# Patient Record
Sex: Female | Born: 1965 | Race: White | Hispanic: No
Health system: Midwestern US, Community
[De-identification: ages and names within clinical notes are randomized; demographics above are authoritative.]

## PROBLEM LIST (undated history)

## (undated) DIAGNOSIS — Z1231 Encounter for screening mammogram for malignant neoplasm of breast: Secondary | ICD-10-CM

## (undated) DIAGNOSIS — F419 Anxiety disorder, unspecified: Secondary | ICD-10-CM

## (undated) DIAGNOSIS — I10 Essential (primary) hypertension: Secondary | ICD-10-CM

---

## 2009-12-04 ENCOUNTER — Encounter: Admission: RE | Admit: 2009-12-04 | Discharge: 2009-12-04 | Payer: Self-pay | Admitting: Neurological Surgery

## 2013-03-12 ENCOUNTER — Ambulatory Visit (HOSPITAL_COMMUNITY): Payer: Self-pay | Admitting: Psychiatry

## 2016-08-05 ENCOUNTER — Emergency Department (HOSPITAL_BASED_OUTPATIENT_CLINIC_OR_DEPARTMENT_OTHER)
Admission: EM | Admit: 2016-08-05 | Discharge: 2016-08-05 | Disposition: A | Discharge status: Home / Self Care | Payer: Self-pay | Attending: Emergency Medicine | Admitting: Emergency Medicine

## 2016-08-05 ENCOUNTER — Encounter (HOSPITAL_BASED_OUTPATIENT_CLINIC_OR_DEPARTMENT_OTHER): Payer: Self-pay | Admitting: *Deleted

## 2016-08-05 DIAGNOSIS — I10 Essential (primary) hypertension: Secondary | ICD-10-CM | POA: Insufficient documentation

## 2016-08-05 DIAGNOSIS — K029 Dental caries, unspecified: Secondary | ICD-10-CM | POA: Insufficient documentation

## 2016-08-05 DIAGNOSIS — Z79899 Other long term (current) drug therapy: Secondary | ICD-10-CM | POA: Insufficient documentation

## 2016-08-05 HISTORY — DX: Essential (primary) hypertension: I10

## 2016-08-05 HISTORY — DX: Anxiety disorder, unspecified: F41.9

## 2016-08-05 LAB — PREGNANCY, URINE: Preg Test, Ur: NEGATIVE

## 2016-08-05 MED ORDER — DOXYCYCLINE HYCLATE 100 MG PO TABS
100.0000 mg | ORAL_TABLET | Freq: Once | ORAL | Status: AC
  Administered 2016-08-05: 100 mg via ORAL
  Filled 2016-08-05: qty 1

## 2016-08-05 MED ORDER — NAPROXEN 500 MG PO TABS: 500.0000 mg | ORAL_TABLET | Freq: Two times a day (BID) | ORAL | 0 refills | Status: DC

## 2016-08-05 MED ORDER — DOXYCYCLINE HYCLATE 100 MG PO CAPS: 100.0000 mg | ORAL_CAPSULE | Freq: Two times a day (BID) | ORAL | 0 refills | Status: DC

## 2016-08-05 NOTE — ED Triage Notes (Signed)
Toothache x 2 weeks. Pain radiates into her left ear.

## 2016-08-05 NOTE — ED Notes (Signed)
Pt verbalizes understanding of d/c instructions and denies any further needs at this time. 

## 2016-08-05 NOTE — ED Notes (Addendum)
Patient c/o left lower dental pain, slight swelling to left lower jaw. Patient c/o pain radiating into her ear. Patient states she has not called a dentist due to cost. Patient is awaiting appointment for "orange card" on 3/19. Patient took 1 Aleve at @ 1530 today.

## 2016-08-05 NOTE — ED Provider Notes (Signed)
[facility] Provider Note   CSN: [id number] Arrival date & time: 08/05/16  2006   By signing my name below, I, Soijett Blue, attest that this documentation has been prepared under the direction and in the presence of Fayrene Helper, PA-C Electronically Signed: Soijett Blue, ED Scribe. 08/05/16. 9:39 PM.  History   Chief Complaint Chief Complaint  Patient presents with  . Dental Pain    HPI Natasha Day is a 51 y.o. female with a PMHx of HTN, who presents to the Emergency Department complaining of sharp left lower dental pain onset 1 week ago. Pt reports associated left ear pain and left sided neck pain. Pt has tried ibuprofen with no relief of her symptoms. Pt left lower dental pain is worsened with temperature change. She states that she doesn't have a dentist, but notes that she is waiting for her "orange card" insurance to begin on 3/19. She denies fever, chills, drainage, and any other symptoms. Denies being pregnant at this time.   The history is provided by the patient. No language interpreter was used.    Past Medical History:  Diagnosis Date  . Anxiety   . Hypertension     There are no active problems to display for this patient.   No past surgical history on file.  OB History    No data available       Home Medications    Prior to Admission medications   Medication Sig Start Date End Date Taking? Authorizing Provider  AMLODIPINE-ATORVASTATIN PO Take by mouth.   Yes Historical Provider, MD  Cyclobenzaprine HCl (FLEXERIL PO) Take by mouth.   Yes Historical Provider, MD  DULoxetine HCl (CYMBALTA PO) Take by mouth.   Yes Historical Provider, MD  HYDROXYZINE HCL PO Take by mouth.   Yes Historical Provider, MD  IBUPROFEN PO Take by mouth.   Yes Historical Provider, MD    Family History No family history on file.  Social History Social History  Substance Use Topics  . Smoking status: Not on file  . Smokeless tobacco: Not on file  . Alcohol  use Not on file     Allergies   Patient has no allergy information on record.   Review of Systems Review of Systems  Constitutional: Negative for chills and fever.  HENT: Positive for dental problem (left lower) and ear pain (left).        No drainage to affected areas  Musculoskeletal: Positive for neck pain (left sided).     Physical Exam Updated Vital Signs BP (!) 183/106 (BP Location: Left Arm)   Pulse 88   Temp 99 F (37.2 C) (Oral)   Resp 18   Ht 5\' 8"  (1.727 m)   Wt 225 lb (102.1 kg)   SpO2 97%   BMI 34.21 kg/m   Physical Exam  Constitutional: She is oriented to person, place, and time. She appears well-developed and well-nourished. No distress.  HENT:  Head: Normocephalic and atraumatic.  Right Ear: Tympanic membrane, external ear and ear canal normal.  Left Ear: Tympanic membrane, external ear and ear canal normal.  Mouth/Throat: No trismus in the jaw. Abnormal dentition. No dental abscesses.  Moderate dental decay noted to lower jaw with tenderness to tooth #17, but without any obvious abscess available for drainage. No trismus. Partial maxillary denture.   Eyes: EOM are normal.  Neck: Neck supple.  Cardiovascular: Normal rate.   Pulmonary/Chest: Effort normal. No respiratory distress.  Abdominal: She exhibits no distension.  Musculoskeletal: Normal range  of motion.  Neurological: She is alert and oriented to person, place, and time.  Skin: Skin is warm and dry.  Psychiatric: She has a normal mood and affect. Her behavior is normal.  Nursing note and vitals reviewed.    ED Treatments / Results  DIAGNOSTIC STUDIES: Oxygen Saturation is 97% on RA, nl by my interpretation.    COORDINATION OF CARE: 9:35 PM Discussed treatment plan with pt at bedside which includes doxycycline Rx, referral and follow up with dentist, and pt agreed to plan. Procedures Procedures (including critical care time)  Medications Ordered in ED Medications - No data to  display   Initial Impression / Assessment and Plan / ED Course  I have reviewed the triage vital signs and the nursing notes.  BP (!) 183/106 (BP Location: Left Arm)   Pulse 88   Temp 99 F (37.2 C) (Oral)   Resp 18   Ht 5\' 8"  (1.727 m)   Wt 102.1 kg   SpO2 97%   BMI 34.21 kg/m  The patient was noted to be hypertensive today in the emergency department. I have spoken with the patient regarding hypertension and the need for improved management. I instructed the patient to followup with the Primary care doctor within 4 days to improve the management of the patient's hypertension. I also counseled the patient regarding the signs and symptoms which would require an emergent visit to an emergency department for hypertensive urgency and/or hypertensive emergency. The patient understood the need for improved hypertensive management.  Patient with dentalgia.  No abscess requiring immediate incision and drainage.  Exam not concerning for Ludwig's angina or pharyngeal abscess. Pain is reproducible, no associate cp/sob to suggest cardiac pathology. Will treat with doxycycline Rx. Pt instructed to follow-up with dentist.  Discussed return precautions. Pt safe for discharge.  Final Clinical Impressions(s) / ED Diagnoses   Final diagnoses:  Pain due to dental caries    New Prescriptions New Prescriptions   DOXYCYCLINE (VIBRAMYCIN) 100 MG CAPSULE    Take 1 capsule (100 mg total) by mouth 2 (two) times daily. One po bid x 7 days   NAPROXEN (NAPROSYN) 500 MG TABLET    Take 1 tablet (500 mg total) by mouth 2 (two) times daily.    I personally performed the services described in this documentation, which was scribed in my presence. The recorded information has been reviewed and is accurate.       Fayrene HelperBowie Fredricka Kohrs, PA-C 08/05/16 2224    Nira ConnPedro Eduardo Cardama, MD 08/06/16 Georgiann [id number]

## 2016-08-05 NOTE — Discharge Instructions (Signed)
Please follow up with a dentist for further management of your dental pain.  Take antibiotic and pain medication as prescribed.  Your blood pressure is high today, please have it recheck by your primary care provider this week.

## 2017-05-12 ENCOUNTER — Other Ambulatory Visit: Payer: Self-pay

## 2017-05-12 ENCOUNTER — Encounter (HOSPITAL_BASED_OUTPATIENT_CLINIC_OR_DEPARTMENT_OTHER): Payer: Self-pay | Admitting: *Deleted

## 2017-05-12 ENCOUNTER — Emergency Department (HOSPITAL_BASED_OUTPATIENT_CLINIC_OR_DEPARTMENT_OTHER)
Admission: EM | Admit: 2017-05-12 | Discharge: 2017-05-12 | Disposition: A | Discharge status: Home / Self Care | Payer: Self-pay | Attending: Emergency Medicine | Admitting: Emergency Medicine

## 2017-05-12 DIAGNOSIS — F17200 Nicotine dependence, unspecified, uncomplicated: Secondary | ICD-10-CM | POA: Insufficient documentation

## 2017-05-12 DIAGNOSIS — Z79899 Other long term (current) drug therapy: Secondary | ICD-10-CM | POA: Insufficient documentation

## 2017-05-12 DIAGNOSIS — K0889 Other specified disorders of teeth and supporting structures: Secondary | ICD-10-CM | POA: Insufficient documentation

## 2017-05-12 DIAGNOSIS — I10 Essential (primary) hypertension: Secondary | ICD-10-CM | POA: Insufficient documentation

## 2017-05-12 MED ORDER — IBUPROFEN 400 MG PO TABS
400.0000 mg | ORAL_TABLET | Freq: Once | ORAL | Status: AC | PRN
  Administered 2017-05-12: 400 mg via ORAL
  Filled 2017-05-12: qty 1

## 2017-05-12 MED ORDER — DOXYCYCLINE HYCLATE 100 MG PO TABS
100.0000 mg | ORAL_TABLET | Freq: Once | ORAL | Status: AC
  Administered 2017-05-12: 100 mg via ORAL
  Filled 2017-05-12: qty 1

## 2017-05-12 MED ORDER — LIDOCAINE VISCOUS 2 % MT SOLN
15.0000 mL | Freq: Once | OROMUCOSAL | Status: AC
  Administered 2017-05-12: 15 mL via OROMUCOSAL
  Filled 2017-05-12: qty 15

## 2017-05-12 MED ORDER — DOXYCYCLINE HYCLATE 100 MG PO CAPS: 100.0000 mg | ORAL_CAPSULE | Freq: Two times a day (BID) | ORAL | 0 refills | Status: DC

## 2017-05-12 MED FILL — DOXYCYCLINE HYCLATE 100 MG: 100 | 10 days supply | Qty: 20 | Fill #0

## 2017-05-12 NOTE — ED Notes (Signed)
Pt asked me for pain medication. When I returned with the medication she was not in the waiting room.

## 2017-05-12 NOTE — ED Triage Notes (Signed)
Toothache since yesterday 

## 2017-05-12 NOTE — ED Provider Notes (Signed)
[facility] EMERGENCY DEPARTMENT Provider Note   CSN: [id number] Arrival date & time: 05/12/17  1150     History   Chief Complaint Chief Complaint  Patient presents with  . Dental Pain    HPI Natasha Day is a 51 y.o. female.  Patient presents to the emergency department with a dental complaint. Symptoms began 2 days ago. The patient has tried to alleviate pain with OTC meds.  Pain rated at a 10/10, characterized as throbbing in nature and located right lower rear molar. Patient denies fever, night sweats, chills, difficulty swallowing or opening mouth, SOB, nuchal rigidity or decreased ROM of neck.  Patient does not have a dentist and requests a resource guide at discharge.    The history is provided by the patient. No language interpreter was used.    Past Medical History:  Diagnosis Date  . Anxiety   . Hypertension     There are no active problems to display for this patient.   History reviewed. No pertinent surgical history.  OB History    No data available       Home Medications    Prior to Admission medications   Medication Sig Start Date End Date Taking? Authorizing Provider  AMLODIPINE-ATORVASTATIN PO Take by mouth.   Yes [provider]  ATENOLOL PO Take by mouth.   Yes [provider]  Cyclobenzaprine HCl (FLEXERIL PO) Take by mouth.   Yes [provider]  DULoxetine HCl (CYMBALTA PO) Take by mouth.   Yes [provider]  HYDROXYZINE HCL PO Take by mouth.   Yes [provider]  IBUPROFEN PO Take by mouth.   Yes [provider]  doxycycline (VIBRAMYCIN) 100 MG capsule Take 1 capsule (100 mg total) by mouth 2 (two) times daily. 05/12/17   Roxy HorsemanBrowning, Mitsuye Schrodt, PA-C  naproxen (NAPROSYN) 500 MG tablet Take 1 tablet (500 mg total) by mouth 2 (two) times daily. 08/05/16   Fayrene Helperran, Bowie, PA-C    Family History No family history on file.  Social History Social History   Tobacco Use  . Smoking  status: Current Every Day Smoker  . Smokeless tobacco: Never Used  Substance Use Topics  . Alcohol use: Not on file  . Drug use: Not on file     Allergies   Erythromycin; Norco [hydrocodone-acetaminophen]; Penicillins; and Shellfish allergy   Review of Systems Review of Systems  Constitutional: Negative for chills and fever.  HENT: Positive for dental problem. Negative for drooling.   Neurological: Negative for speech difficulty.  Psychiatric/Behavioral: Positive for sleep disturbance.  All other systems reviewed and are negative.    Physical Exam Updated Vital Signs BP (!) 159/111 (BP Location: Right Arm)   Pulse 89   Temp 98.1 F (36.7 C) (Oral)   Resp 18   Ht 5' 8.75" (1.746 m)   Wt 106.6 kg (235 lb)   LMP 05/05/2017   SpO2 100%   BMI 34.96 kg/m   Physical Exam  Physical Exam  Constitutional: Pt appears well-developed and well-nourished.  HENT:  Head: Normocephalic.  Right Ear: Tympanic membrane, external ear and ear canal normal.  Left Ear: Tympanic membrane, external ear and ear canal normal.  Nose: Nose normal. Right sinus exhibits no maxillary sinus tenderness and no frontal sinus tenderness. Left sinus exhibits no maxillary sinus tenderness and no frontal sinus tenderness.  Mouth/Throat: Uvula is midline, oropharynx is clear and moist and mucous membranes are normal. No oral lesions. No uvula swelling or lacerations. No oropharyngeal  exudate, posterior oropharyngeal edema, posterior oropharyngeal erythema or tonsillar abscesses.  Poor dentition No gingival swelling, fluctuance or induration No gross abscess  No sublingual edema, tenderness to palpation, or sign of Ludwig's angina, or deep space infection Pain at right lower rear molar Eyes: Conjunctivae are normal. Pupils are equal, round, and reactive to light. Right eye exhibits no discharge. Left eye exhibits no discharge.  Neck: Normal range of motion. Neck supple.  No stridor Handling secretions  without difficulty No nuchal rigidity No cervical lymphadenopathy Cardiovascular: Normal rate, regular rhythm and normal heart sounds.   Pulmonary/Chest: Effort normal. No respiratory distress.  Equal chest rise  Abdominal: Soft. Bowel sounds are normal. Pt exhibits no distension. There is no tenderness.  Lymphadenopathy: Pt has no cervical adenopathy.  Neurological: Pt is alert and oriented x 4  Skin: Skin is warm and dry.  Psychiatric: Pt has a normal mood and affect.  Nursing note and vitals reviewed.   ED Treatments / Results  Labs (all labs ordered are listed, but only abnormal results are displayed) Labs Reviewed - No data to display  EKG  EKG Interpretation None       Radiology No results found.  Procedures Procedures (including critical care time)  Medications Ordered in ED Medications  ibuprofen (ADVIL,MOTRIN) tablet 400 mg (400 mg Oral Given 05/12/17 1248)  lidocaine (XYLOCAINE) 2 % viscous mouth solution 15 mL (15 mLs Mouth/Throat Given 05/12/17 1513)  doxycycline (VIBRA-TABS) tablet 100 mg (100 mg Oral Given 05/12/17 1538)     Initial Impression / Assessment and Plan / ED Course  I have reviewed the triage vital signs and the nursing notes.  Pertinent labs & imaging results that were available during my care of the patient were reviewed by me and considered in my medical decision making (see chart for details).     Patient with dentalgia.  No abscess requiring immediate incision and drainage.  Exam not concerning for Ludwig's angina or pharyngeal abscess.  Will treat with doxy per pharmD recommendations given multiple allergies. Pt instructed to follow-up with dentist.  Discussed return precautions. Pt safe for discharge.   Final Clinical Impressions(s) / ED Diagnoses   Final diagnoses:  Pain, dental    ED Discharge Orders        Ordered    doxycycline (VIBRAMYCIN) 100 MG capsule  2 times daily     05/12/17 1530       Roxy HorsemanBrowning, Joie Hipps,  PA-C 05/12/17 1551    Tegeler, Canary Brimhristopher J, MD 05/12/17 1723

## 2018-04-20 ENCOUNTER — Ambulatory Visit (HOSPITAL_COMMUNITY): Payer: Self-pay | Admitting: Orthopedic Surgery

## 2018-05-26 ENCOUNTER — Other Ambulatory Visit (HOSPITAL_COMMUNITY): Payer: Self-pay

## 2018-06-03 ENCOUNTER — Inpatient Hospital Stay: Admit: 2018-06-03 | Payer: Self-pay | Admitting: Orthopedic Surgery

## 2018-06-03 SURGERY — ANTERIOR CERVICAL DECOMPRESSION/DISCECTOMY FUSION 4 LEVELS
Anesthesia: General

## 2018-06-04 ENCOUNTER — Inpatient Hospital Stay: Admit: 2018-06-04 | Payer: Self-pay | Admitting: Orthopedic Surgery

## 2018-06-04 SURGERY — POSTERIOR CERVICAL FUSION/FORAMINOTOMY LEVEL 4
Anesthesia: General

## 2018-06-29 ENCOUNTER — Encounter: Payer: Self-pay | Admitting: Cardiology

## 2018-06-29 ENCOUNTER — Ambulatory Visit (INDEPENDENT_AMBULATORY_CARE_PROVIDER_SITE_OTHER): Payer: No Typology Code available for payment source | Admitting: Cardiology

## 2018-06-29 VITALS — BP 144/102 | HR 85 | Ht 68.0 in | Wt 235.6 lb

## 2018-06-29 DIAGNOSIS — I10 Essential (primary) hypertension: Secondary | ICD-10-CM

## 2018-06-29 DIAGNOSIS — R002 Palpitations: Secondary | ICD-10-CM

## 2018-06-29 DIAGNOSIS — Z7189 Other specified counseling: Secondary | ICD-10-CM

## 2018-06-29 DIAGNOSIS — R079 Chest pain, unspecified: Secondary | ICD-10-CM

## 2018-06-29 DIAGNOSIS — Z716 Tobacco abuse counseling: Secondary | ICD-10-CM

## 2018-06-29 NOTE — Progress Notes (Signed)
Cardiology Office Note:    Date:  06/29/2018   ID:  Natasha Day, DOB 1965-07-03, MRN [id number]  PCP:  [facility]  Cardiologist:  Jodelle Red, MD PhD  Referring MD: Nechama Guard, FNP   CC: chest pain and palpitations  History of Present Illness:    Natasha Day is a 53 y.o. female with a hx of hypertension, hyperlipidemia, tobacco use who is seen as a new consult at the request of Nechama Guard, FNP for the evaluation and management of chest pain and palpitations. She was seen by Dr. Durene Cal at [facility] at [facility] for this on 06/03/2018.  Per notes, Natasha Day was having shortness of breath, sternal pain, and lightheadedness with deep breathing. She was seen by Dr. Luberta Robertson in the past (2010) and had lexiscan nuclear study 11.23.10 which was negative for ischemia, with EF of 67%.   Patient history: Was trying to do a clinical trial, they did an EKG and told her she had a prior heart attack. She has never had a heart cath, echo. Did a distant stress test, was told she had atrial fibrillation, murmur, and heart disease. Went to sleep clinic, also told she had afib. Was diagnosed with sleep apnea, used to have CPAP but no longer has due to lack of insurance.   Chest pain: -Initial onset: believes there were three times when she thought she might be having a heart attack. In fall of 2018, had chest pain, radiated to arm, felt nauseated/had emesis, was dizzy and weak. Went to [facility], BP very elevated, but was discharged from ER and was told she was fine. Had two episodes, each about 3 mos apart and prior to fall 2018, felt very sharp pain, radiated to arm, occurred while she was driving. Both involved back pain and nausea.  Had a mild episode several days ago of mild tightness in her chest. Occurred during a stressful event when she was arguing with her daughter. Last 2-3 minutes, nausea, shortness of breath. Has also  had chest tightness on awakening, like a ton of brick on her chest.   -Alcohol: occasional -Tobacco: smoking 1-2 ppd.  -Comorbidities: hypertension, on 10 mg amlodipine pill. Hasn't started other BP medication yet -Exercise level: none, feels weak even with standing to do dishes. -Cardiac ROS: no shortness of breath, no PND, no orthopnea, no LE edema, no syncope -Family history: mom had MI in her 15s, father had MI and stents in his 26s-60s. Other more distant family members also with heart disease of unknown type. Grandfather, uncles have had strokes.  Palpitations: happening nearly constantly every day, has been told she has afib in the past.   Past Medical History:  Diagnosis Date  . Anxiety   . Hypertension     No past surgical history on file.  Current Medications: Current Outpatient Medications on File Prior to Visit  Medication Sig  . amLODipine (NORVASC) 10 MG tablet Take by mouth daily.   . DULoxetine HCl (CYMBALTA PO) Take by mouth.  . hydrOXYzine (ATARAX/VISTARIL) 25 MG tablet Take by mouth.   Marland Kitchen ibuprofen (ADVIL,MOTRIN) 800 MG tablet Take 800 mg by mouth every 6 (six) hours as needed.    No current facility-administered medications on file prior to visit.      Allergies:   Erythromycin; Norco [hydrocodone-acetaminophen]; Penicillins; Shellfish allergy; Sulfa antibiotics; Iodinated diagnostic agents; and Methocarbamol   Social History   Socioeconomic History  . Marital status: Divorced  Spouse name: Not on file  . Number of children: Not on file  . Years of education: Not on file  . Highest education level: Not on file  Occupational History  . Not on file  Social Needs  . Financial resource strain: Not on file  . Food insecurity:    Worry: Not on file    Inability: Not on file  . Transportation needs:    Medical: Not on file    Non-medical: Not on file  Tobacco Use  . Smoking status: Current Every Day Smoker  . Smokeless tobacco: Never Used  Substance  and Sexual Activity  . Alcohol use: Not on file  . Drug use: Not on file  . Sexual activity: Not on file  Lifestyle  . Physical activity:    Days per week: Not on file    Minutes per session: Not on file  . Stress: Not on file  Relationships  . Social connections:    Talks on phone: Not on file    Gets together: Not on file    Attends religious service: Not on file    Active member of club or organization: Not on file    Attends meetings of clubs or organizations: Not on file    Relationship status: Not on file  Other Topics Concern  . Not on file  Social History Narrative  . Not on file     Family History: The patient's family history includes Arthritis in her father; Heart attack in her maternal grandmother and mother; High blood pressure in her maternal grandmother and mother; Sleep apnea in her maternal grandmother.  ROS:   Please see the history of present illness.  Additional pertinent ROS:  Constitutional: Negative for chills, fever, unintentional weight loss  HENT: Negative for ear pain and hearing loss.   Eyes: Negative for loss of vision and eye pain.  Respiratory: Negative for cough, sputum, shortness of breath, wheezing.   Cardiovascular: See HPI. Gastrointestinal: Negative for abdominal pain, melena, and hematochezia.  Genitourinary: Negative for dysuria and hematuria.  Musculoskeletal: Negative for falls. Positive for joint/muscle pain Skin: Negative for itching and rash.  Neurological: Negative for focal weakness, focal sensory changes and loss of consciousness.  Endo/Heme/Allergies: Does not bruise/bleed easily.    EKGs/Labs/Other Studies Reviewed:    The following studies were reviewed today: Notes received from PCP  EKG:  EKG is personally reviewed.  The ekg ordered today demonstrates normal sinus rhythm, poor R wave progression  Recent Labs: No results found for requested labs within last 8760 hours.  Recent Lipid Panel No results found for:  CHOL, TRIG, HDL, CHOLHDL, VLDL, LDLCALC, LDLDIRECT  Physical Exam:    VS:  BP (!) 144/102   Pulse 85   Ht 5\' 8"  (1.727 m)   Wt 235 lb 9.6 oz (106.9 kg)   LMP 06/24/2018   BMI 35.82 kg/m     Wt Readings from Last 3 Encounters:  06/29/18 235 lb 9.6 oz (106.9 kg)  05/12/17 235 lb (106.6 kg)  08/05/16 225 lb (102.1 kg)     GEN: Well nourished, well developed in no acute distress. Wearing cervical collar. HEENT: Normal NECK: No JVD; No carotid bruits LYMPHATICS: No lymphadenopathy CARDIAC: regular rhythm, normal S1 and S2, no murmurs, rubs, gallops. Radial and DP pulses 2+ bilaterally. RESPIRATORY:  Clear to auscultation without rales, wheezing or rhonchi  ABDOMEN: Soft, non-tender, non-distended MUSCULOSKELETAL:  No edema; No deformity  SKIN: Warm and dry NEUROLOGIC:  Alert and oriented x  3 PSYCHIATRIC:  Normal affect   ASSESSMENT:    1. Chest pain, unspecified type   2. Palpitations   3. Tobacco abuse counseling   4. Counseling on health promotion and disease prevention   5. Essential hypertension    PLAN:    1. Chest pain: both typical and atypical symptoms. Risk factors of hypertension, tobacco use, elevated 10 year ASCVD risk.   -lexiscan stress test  -counseled on red flag symptoms that need immediate medical attention  2. Hypertension: elevated today. Was prescribed additional meds through PCP but has not picked up that prescription yet.   -continue amlodipine 10 mg  -agree with losartan-HCTZ ordered by PCP  3. Palpitations: bothersome to her, frequent  -48 hour holter monitor  4. Tobacco abuse: The patient was counseled on tobacco cessation today for 5 minutes.  Counseling included reviewing the risks of smoking tobacco products, how it impacts the patient's current medical diagnoses and different strategies for quitting.  Pharmacotherapy to aid in tobacco cessation was not prescribed today.  5. Primary prevention -recommend heart healthy/Mediterranean diet,  with whole grains, fruits, vegetable, fish, lean meats, nuts, and olive oil. Limit salt. -recommend moderate walking, 3-5 times/week for 30-50 minutes each session. Aim for at least 150 minutes.week. Goal should be pace of 3 miles/hours, or walking 1.5 miles in 30 minutes -recommend avoidance of tobacco products. Avoid excess alcohol. -Additional risk factor control:  -Diabetes: A1c is 5.7  -Lipids: per notes, Tchol 184, TG 169, HDL 41, LDL 109  -Blood pressure control: as above  -Weight: BMI 35, precontemplative -ASCVD risk score: 15.1%  Plan for follow up: 2 mos  Medication Adjustments/Labs and Tests Ordered: Current medicines are reviewed at length with the patient today.  Concerns regarding medicines are outlined above.  Orders Placed This Encounter  Procedures  . HOLTER MONITOR - 48 HOUR  . MYOCARDIAL PERFUSION IMAGING  . EKG 12-Lead   No orders of the defined types were placed in this encounter.   Patient Instructions  Medication Instructions:  Your Physician recommend you continue on your current medication as directed.    If you need a refill on your cardiac medications before your next appointment, please call your pharmacy.   Lab work: None  Testing/Procedures: Your physician has recommended that you wear a 48 holter monitor. Holter monitors are medical devices that record the heart's electrical activity. Doctors most often use these monitors to diagnose arrhythmias. Arrhythmias are problems with the speed or rhythm of the heartbeat. The monitor is a small, portable device. You can wear one while you do your normal daily activities. This is usually used to diagnose what is causing palpitations/syncope (passing out). [address]  Your physician has requested that you have a lexiscan myoview. For further information please visit [email or web address] Please follow instruction sheet, as given. [address]    Follow-Up: At [facility], you and your health needs are our priority.  As part of our continuing mission to provide you with exceptional heart care, we have created designated Provider Care Teams.  These Care Teams include your primary Cardiologist (physician) and Advanced Practice Providers (APPs -  Physician Assistants and Nurse Practitioners) who all work together to provide you with the care you need, when you need it. You will need a follow up appointment in 2 months.  Please call our office 2 months in advance to schedule this appointment.  You may see Jodelle Red, MD or one of the  following Advanced Practice Providers on your designated Care Team:   Theodore DemarkRhonda Barrett, PA-C . Joni ReiningKathryn Lawrence, DNP, ANP        Signed, Jodelle RedBridgette Shaniqua Guillot, MD PhD 06/29/2018 3:09 PM    [facility]

## 2018-06-29 NOTE — Patient Instructions (Signed)
Medication Instructions:  Your Physician recommend you continue on your current medication as directed.    If you need a refill on your cardiac medications before your next appointment, please call your pharmacy.   Lab work: None  Testing/Procedures: Your physician has recommended that you wear a 48 holter monitor. Holter monitors are medical devices that record the heart's electrical activity. Doctors most often use these monitors to diagnose arrhythmias. Arrhythmias are problems with the speed or rhythm of the heartbeat. The monitor is a small, portable device. You can wear one while you do your normal daily activities. This is usually used to diagnose what is causing palpitations/syncope (passing out). [address]  Your physician has requested that you have a lexiscan myoview. For further information please visit [email or web address] Please follow instruction sheet, as given. [address]    Follow-Up: At [facility], you and your health needs are our priority.  As part of our continuing mission to provide you with exceptional heart care, we have created designated Provider Care Teams.  These Care Teams include your primary Cardiologist (physician) and Advanced Practice Providers (APPs -  Physician Assistants and Nurse Practitioners) who all work together to provide you with the care you need, when you need it. You will need a follow up appointment in 2 months.  Please call our office 2 months in advance to schedule this appointment.  You may see Jodelle Red, MD or one of the following Advanced Practice Providers on your designated Care Team:   Theodore Demark, PA-C . Joni Reining, DNP, ANP

## 2018-06-30 ENCOUNTER — Encounter: Payer: Self-pay | Admitting: Cardiology

## 2018-07-02 ENCOUNTER — Telehealth (HOSPITAL_COMMUNITY): Payer: Self-pay

## 2018-07-02 NOTE — Telephone Encounter (Signed)
Encounter complete. 

## 2018-07-03 ENCOUNTER — Telehealth (HOSPITAL_COMMUNITY): Payer: Self-pay

## 2018-07-03 NOTE — Telephone Encounter (Signed)
Unable to reach patient. Encounter complete. 

## 2018-07-07 ENCOUNTER — Inpatient Hospital Stay (HOSPITAL_COMMUNITY): Admission: RE | Admit: 2018-07-07 | Payer: Self-pay | Source: Ambulatory Visit

## 2018-07-08 ENCOUNTER — Ambulatory Visit (HOSPITAL_COMMUNITY): Admission: RE | Admit: 2018-07-08 | Payer: No Typology Code available for payment source | Source: Ambulatory Visit

## 2018-07-13 ENCOUNTER — Ambulatory Visit: Payer: No Typology Code available for payment source

## 2018-07-13 DIAGNOSIS — R002 Palpitations: Secondary | ICD-10-CM

## 2018-07-15 ENCOUNTER — Ambulatory Visit (HOSPITAL_COMMUNITY): Payer: No Typology Code available for payment source

## 2018-07-17 ENCOUNTER — Encounter (HOSPITAL_COMMUNITY): Payer: Self-pay

## 2018-07-17 ENCOUNTER — Telehealth (HOSPITAL_COMMUNITY): Payer: Self-pay

## 2018-07-17 NOTE — Telephone Encounter (Signed)
Encounter complete. 

## 2018-07-20 ENCOUNTER — Ambulatory Visit (HOSPITAL_COMMUNITY): Payer: No Typology Code available for payment source

## 2018-07-21 ENCOUNTER — Ambulatory Visit (HOSPITAL_COMMUNITY): Payer: Self-pay

## 2018-07-22 ENCOUNTER — Ambulatory Visit (HOSPITAL_COMMUNITY): Payer: Self-pay

## 2018-07-22 ENCOUNTER — Ambulatory Visit (HOSPITAL_COMMUNITY): Payer: No Typology Code available for payment source

## 2018-07-23 ENCOUNTER — Inpatient Hospital Stay (HOSPITAL_COMMUNITY): Admission: RE | Admit: 2018-07-23 | Payer: No Typology Code available for payment source | Source: Ambulatory Visit

## 2018-07-23 ENCOUNTER — Telehealth (HOSPITAL_COMMUNITY): Payer: Self-pay

## 2018-07-23 NOTE — Telephone Encounter (Signed)
Encounter complete. 

## 2018-07-24 ENCOUNTER — Telehealth (HOSPITAL_COMMUNITY): Payer: Self-pay

## 2018-07-24 NOTE — Telephone Encounter (Signed)
Encounter complete. 

## 2018-07-28 ENCOUNTER — Ambulatory Visit (HOSPITAL_COMMUNITY)
Admission: RE | Admit: 2018-07-28 | Discharge: 2018-07-28 | Disposition: A | Discharge status: Home / Self Care | Payer: No Typology Code available for payment source | Source: Ambulatory Visit | Attending: Cardiology | Admitting: Cardiology

## 2018-07-28 DIAGNOSIS — R079 Chest pain, unspecified: Secondary | ICD-10-CM | POA: Insufficient documentation

## 2018-07-28 MED ORDER — AMINOPHYLLINE 25 MG/ML IV SOLN
75.0000 mg | Freq: Once | INTRAVENOUS | Status: AC
  Administered 2018-07-28: 75 mg via INTRAVENOUS

## 2018-07-28 MED ORDER — REGADENOSON 0.4 MG/5ML IV SOLN
0.4000 mg | Freq: Once | INTRAVENOUS | Status: AC
  Administered 2018-07-28: 0.4 mg via INTRAVENOUS

## 2018-07-28 MED ORDER — TECHNETIUM TC 99M TETROFOSMIN IV KIT
29.7000 | PACK | Freq: Once | INTRAVENOUS | Status: AC | PRN
  Administered 2018-07-28: 29.7 via INTRAVENOUS
  Filled 2018-07-28: qty 30

## 2018-07-29 ENCOUNTER — Ambulatory Visit (HOSPITAL_COMMUNITY): Payer: No Typology Code available for payment source

## 2018-07-31 ENCOUNTER — Ambulatory Visit (HOSPITAL_COMMUNITY)
Admission: RE | Admit: 2018-07-31 | Discharge: 2018-07-31 | Disposition: A | Discharge status: Home / Self Care | Payer: No Typology Code available for payment source | Source: Ambulatory Visit | Attending: Cardiovascular Disease | Admitting: Cardiovascular Disease

## 2018-07-31 LAB — MYOCARDIAL PERFUSION IMAGING
CHL CUP NUCLEAR SDS: 1
CHL CUP NUCLEAR SRS: 0
CHL CUP RESTING HR STRESS: 90 {beats}/min
CSEPPHR: 108 {beats}/min
LV dias vol: 85 mL (ref 46–106)
LVSYSVOL: 35 mL
NUC STRESS TID: 1.04
SSS: 1

## 2018-07-31 MED ORDER — TECHNETIUM TC 99M TETROFOSMIN IV KIT
30.8000 | PACK | Freq: Once | INTRAVENOUS | Status: AC | PRN
  Administered 2018-07-31: 30.8 via INTRAVENOUS

## 2018-08-26 ENCOUNTER — Encounter: Payer: Self-pay | Admitting: Cardiology

## 2018-08-26 ENCOUNTER — Telehealth: Payer: Self-pay | Admitting: Cardiology

## 2018-08-26 NOTE — Telephone Encounter (Signed)
YOUR CARDIOLOGY TEAM HAS ARRANGED FOR AN E-VISIT FOR YOUR APPOINTMENT - PLEASE REVIEW IMPORTANT INFORMATION BELOW SEVERAL DAYS PRIOR TO YOUR APPOINTMENT  Due to the recent COVID-19 pandemic, we are transitioning in-person office visits to tele-medicine visits in an effort to decrease unnecessary exposure to our patients and staff. Medicare and most insurances are covering these visits without a copay needed. We also encourage you to sign up for MyChart if you have not already done so. You will need a smartphone if possible. For patients that do not have this, we can still complete the visit using a regular telephone but do prefer a smartphone to enable video when possible. You may have a close family member that lives with you that can help. If possible, we also ask that you have a blood pressure cuff and scale at home to measure your blood pressure, heart rate and weight prior to your scheduled appointment. Patients with clinical needs that need an in-person evaluation and testing will still be able to come to the office if absolutely necessary. If you have any questions, feel free to call our office.    IF YOU HAVE A SMARTPHONE, PLEASE DOWNLOAD THE WEBEX APP TO YOUR SMARTPHONE  - If Apple, go to [employer] and type in WebEx in the search bar. [place] [employer], the blue/green circle. The app is free but as with any other app download, your phone may require you to verify saved payment information or Apple password. You do NOT have to create a WebEx account.  - If Android, go to [employer] and type in [employer] in the search bar. [place] [employer], the blue/green circle. The app is free but as with any other app download, your phone may require you to verify saved payment information or Android password. You do NOT have to create a WebEx account.  It is very helpful to have this downloaded before your visit.    2-3 DAYS BEFORE YOUR APPOINTMENT  You will receive a  telephone call from one of our [place] team members - your caller ID may say "Unknown caller." If this is a video visit, we will confirm that you have been able to download the WebEx app. We will remind you check your blood pressure, heart rate and weight prior to your scheduled appointment. If you have an Apple Watch or Kardia, please upload any pertinent ECG strips the day before or morning of your appointment to [place]. Our staff will also make sure you have reviewed the consent and agree to move forward with your scheduled tele-health visit.     THE DAY OF YOUR APPOINTMENT  Approximately 15 minutes prior to your scheduled appointment, you will receive a telephone call from one of [facility] team - your caller ID may say "Unknown caller."  Our staff will confirm medications, vital signs for the day and any symptoms you may be experiencing. Please have this information available prior to the time of visit start. It may also be helpful for you to have a pad of paper and pen handy for any instructions given during your visit. They will also walk you through joining the WebEx smartphone meeting if this is a video visit.    CONSENT FOR TELE-HEALTH VISIT - PLEASE REVIEW  I hereby voluntarily request, consent and authorize [facility] and its employed or contracted physicians, physician assistants, nurse practitioners or other licensed health care professionals (the Practitioner), to provide me with telemedicine health care services (the Services") as  deemed necessary by the treating Practitioner. I acknowledge and consent to receive the Services by the Practitioner via telemedicine. I understand that the telemedicine visit will involve communicating with the Practitioner through live audiovisual communication technology and the disclosure of certain medical information by electronic transmission. I acknowledge that I have been given the opportunity to request an in-person assessment or other available  alternative prior to the telemedicine visit and am voluntarily participating in the telemedicine visit.  I understand that I have the right to withhold or withdraw my consent to the use of telemedicine in the course of my care at any time, without affecting my right to future care or treatment, and that the Practitioner or I may terminate the telemedicine visit at any time. I understand that I have the right to inspect all information obtained and/or recorded in the course of the telemedicine visit and may receive copies of available information for a reasonable fee.  I understand that some of the potential risks of receiving the Services via telemedicine include:   Delay or interruption in medical evaluation due to technological equipment failure or disruption;  Information transmitted may not be sufficient (e.g. poor resolution of images) to allow for appropriate medical decision making by the Practitioner; and/or   In rare instances, security protocols could fail, causing a breach of personal health information.  Furthermore, I acknowledge that it is my responsibility to provide information about my medical history, conditions and care that is complete and accurate to the best of my ability. I acknowledge that Practitioner's advice, recommendations, and/or decision may be based on factors not within their control, such as incomplete or inaccurate data provided by me or distortions of diagnostic images or specimens that may result from electronic transmissions. I understand that the practice of medicine is not an exact science and that Practitioner makes no warranties or guarantees regarding treatment outcomes. I acknowledge that I will receive a copy of this consent concurrently upon execution via email to the email address I last provided but may also request a printed copy by calling the office of [facility].    I understand that my insurance will be billed for this visit.   I have read or had  this consent read to me.  I understand the contents of this consent, which adequately explains the benefits and risks of the Services being provided via telemedicine.   I have been provided ample opportunity to ask questions regarding this consent and the Services and have had my questions answered to my satisfaction.  I give my informed consent for the services to be provided through the use of telemedicine in my medical care  By participating in this telemedicine visit I agree to the above.      Virtual Visit Pre-Appointment Phone Call  Steps For Call:  1. Confirm consent - "In the setting of the current Covid19 crisis, you are scheduled for a (phone or video) visit with your provider on (date) at (time).  Just as we do with many in-office visits, in order for you to participate in this visit, we must obtain consent.  If you'd like, I can send this to your mychart (if signed up) or email for you to review.  Otherwise, I can obtain your verbal consent now.  All virtual visits are billed to your insurance company just like a normal visit would be.  By agreeing to a virtual visit, we'd like you to understand that the technology does not allow for your provider  to perform an examination, and thus may limit your provider's ability to fully assess your condition.  Finally, though the technology is pretty good, we cannot assure that it will always work on either your or our end, and in the setting of a video visit, we may have to convert it to a phone-only visit.  In either situation, we cannot ensure that we have a secure connection.  Are you willing to proceed?"  2. Give patient instructions for WebEx download to smartphone as below if video visit  3. Advise patient to be prepared with any vital sign or heart rhythm information, their current medicines, and a piece of paper and pen handy for any instructions they may receive the day of their visit  4. Inform patient they will receive a phone  call 15 minutes prior to their appointment time (may be from unknown caller ID) so they should be prepared to answer  5. Confirm that appointment type is correct in Epic appointment notes (video vs telephone)    TELEPHONE CALL NOTE  Ebany Bracker has been deemed a candidate for a follow-up tele-health visit to limit community exposure during the Covid-19 pandemic. I spoke with the patient via phone to ensure availability of phone/video source, confirm preferred email & phone number, and discuss instructions and expectations.  I reminded Charlesetta Goddard to be prepared with any vital sign and/or heart rhythm information that could potentially be obtained via home monitoring, at the time of her visit. I reminded Caitlyne Klemz to expect a phone call at the time of her visit if her visit.  Did the patient verbally acknowledge consent to treatment? Yes  Evans Lance 08/26/2018 9:47 AM

## 2018-08-27 ENCOUNTER — Other Ambulatory Visit: Payer: Self-pay

## 2018-08-28 ENCOUNTER — Telehealth (INDEPENDENT_AMBULATORY_CARE_PROVIDER_SITE_OTHER): Payer: Self-pay | Admitting: Cardiology

## 2018-08-28 VITALS — Ht 68.0 in | Wt 233.0 lb

## 2018-08-28 DIAGNOSIS — R002 Palpitations: Secondary | ICD-10-CM

## 2018-08-28 DIAGNOSIS — Z712 Person consulting for explanation of examination or test findings: Secondary | ICD-10-CM

## 2018-08-28 DIAGNOSIS — I10 Essential (primary) hypertension: Secondary | ICD-10-CM

## 2018-08-28 NOTE — Patient Instructions (Signed)
Medication Instructions:  Your Physician recommend you continue on your current medication as directed.    If you need a refill on your cardiac medications before your next appointment, please call your pharmacy.   Lab work: None  Testing/Procedures: None  Follow-Up: Your physician recommends that you schedule a follow-up appointment in 6 months with Dr. Christopher      

## 2018-08-28 NOTE — Progress Notes (Signed)
Virtual Visit via Video Note    Evaluation Performed:  Follow-up visit  This visit type was conducted due to national recommendations for restrictions regarding the COVID-19 Pandemic (e.g. social distancing).  This format is felt to be most appropriate for this patient at this time.  All issues noted in this document were discussed and addressed.  No physical exam was performed (except for noted visual exam findings with Video Visits).  Please refer to the patient's chart (MyChart message for video visits and phone note for telephone visits) for the patient's consent to telehealth for [facility] [address]  Date:  08/28/2018   ID:  Natasha Day, DOB [birth year], MRN [id number]  Patient Location:  [address] [place], [place] [address]  Provider location:   Remote access with base in [place], [place] - [facility]   PCP:  [facility]  Cardiologist:  Jodelle Red, MD   Chief Complaint:  Follow up  History of Present Illness:    Natasha Day is a 53 y.o. female who presents via audio/video conferencing for a telehealth visit today.   The patient does not have symptoms concerning for COVID-19 infection (fever, chills, cough, or new shortness of breath).   See initial note 06/29/18 re: consult for chest pain and palpitations. We reviewed the results of her lexiscan, which was negative for ischemia. She did have a monitor placed, but she has not returned it yet.   Had some episodes of fast heartbeats intermittently, similar to prior. No recent chest pain/tightness. Denies shortness of breath at rest or with normal exertion. No PND, orthopnea, LE edema or unexpected weight gain. No syncope.  She has not been checking blood pressures at home, no machine. Last BP check was at our office. She is taking the amlodipine daily. She does not have another medication--reviewed that at our initial visit, she was prescribed losartan-HCTZ by her PCP. She does not  think she ever picked this up. She has been having issues getting financial assistance for her medical care.   Has rare headaches, mild, no severe headaches. No focal neuro symptoms. Has chronic blurry vision, overdue for eye exam.  Prior CV studies:   The following studies were reviewed today: Lexiscan results  Past Medical History:  Diagnosis Date   Anxiety    Hypertension    No past surgical history on file.   Current Meds  Medication Sig   amLODipine (NORVASC) 10 MG tablet Take by mouth daily.    hydrOXYzine (ATARAX/VISTARIL) 25 MG tablet Take by mouth.      Allergies:   Erythromycin; Norco [hydrocodone-acetaminophen]; Penicillins; Shellfish allergy; Sulfa antibiotics; Iodinated diagnostic agents; and Methocarbamol   Social History   Tobacco Use   Smoking status: Current Every Day Smoker    Packs/day: 2.00    Types: Cigarettes   Smokeless tobacco: Never Used  Substance Use Topics   Alcohol use: Yes    Comment: occasional beer or liquor   Drug use: Not Currently     Family Hx: The patient's family history includes Arthritis in her father; Heart attack in her maternal grandmother and mother; High blood pressure in her maternal grandmother and mother; Sleep apnea in her maternal grandmother.  ROS:   Please see the history of present illness.    All other systems reviewed and are negative.   Labs/Other Tests and Data Reviewed:    Recent Labs: No results found for requested labs within last 8760 hours.   Recent Lipid Panel  No results found for: CHOL, TRIG, HDL, CHOLHDL, LDLCALC, LDLDIRECT  Wt Readings from Last 3 Encounters:  07/28/18 235 lb (106.6 kg)  06/29/18 235 lb 9.6 oz (106.9 kg)  05/12/17 235 lb (106.6 kg)     Objective:    Vital Signs:  Ht 5\' 8"  (1.727 m)    Wt 233 lb (105.7 kg)    BMI 35.43 kg/m   Well nourished, well developed female in no acute distress.  ASSESSMENT & PLAN:    1.  Test results: reviewed lexiscan, no evidence of  ischemia. Chest pain improved.  2. Hypertension: does not have a way to check at home. Was elevated at last visit. Never started losartan-HCTZ. Taking amlodipine daily. She isn't sure when she is due to see PCP. Needs BP checked to see if additional meds needed. BP was over goal but not dangerously elevated at visit, and given COVID crisis, will defer on this for now.  3. Palpitations: still occurring. Wore monitor, did not return. Asked if she can just come in quickly to front desk to drop it off, she will try this.  Patient Instructions  Medication Instructions:  Your Physician recommend you continue on your current medication as directed.    If you need a refill on your cardiac medications before your next appointment, please call your pharmacy.   Lab work: None  Testing/Procedures: None  Follow-Up: Your physician recommends that you schedule a follow-up appointment in 6 months with Dr. Cristal Deer.     COVID-19 Education: The signs and symptoms of COVID-19 were discussed with the patient and how to seek care for testing (follow up with PCP or arrange E-visit). The importance of social distancing was discussed today.  Patient Risk:   After full review of this patient's clinical status, I feel that they are at least moderate risk at this time.  Time:   Today, I have spent 17 minutes with the patient with telehealth technology discussing test results and management of palpitations and hypertension.     Medication Adjustments/Labs and Tests Ordered: Current medicines are reviewed at length with the patient today.  Concerns regarding medicines are outlined above.  Tests Ordered: No orders of the defined types were placed in this encounter.  Medication Changes: No orders of the defined types were placed in this encounter.   Disposition:  Follow up in 6 mos  Signed, Jodelle Red, MD  08/28/2018 9:16 AM    [facility]

## 2018-08-29 ENCOUNTER — Encounter: Payer: Self-pay | Admitting: Cardiology

## 2018-08-29 DIAGNOSIS — I10 Essential (primary) hypertension: Secondary | ICD-10-CM | POA: Insufficient documentation

## 2018-08-29 DIAGNOSIS — R002 Palpitations: Secondary | ICD-10-CM | POA: Insufficient documentation

## 2018-11-20 ENCOUNTER — Other Ambulatory Visit: Payer: Self-pay | Admitting: *Deleted

## 2018-11-20 DIAGNOSIS — Z20822 Contact with and (suspected) exposure to covid-19: Secondary | ICD-10-CM

## 2018-11-26 LAB — NOVEL CORONAVIRUS, NAA: SARS-CoV-2, NAA: NOT DETECTED

## 2019-02-05 ENCOUNTER — Telehealth: Payer: Self-pay

## 2019-02-05 ENCOUNTER — Telehealth: Payer: Self-pay | Admitting: Cardiology

## 2019-02-05 NOTE — Telephone Encounter (Signed)
Attempted to contact pt X 3 for scheduled virtual appointment with Dr. Harrell Gave today 9/11 at 8:40. Left message for pt to call back and reschedule.

## 2019-02-05 NOTE — Telephone Encounter (Signed)
Online Info Session Completed:  on 02/03/19 Verified Insurance Benefit   with [employer]    Patient informed the following:  Note:  Patient has a low BMI only 34 - I don't see any co-mobidities

## 2019-11-30 ENCOUNTER — Other Ambulatory Visit: Payer: Self-pay

## 2019-11-30 ENCOUNTER — Emergency Department (HOSPITAL_COMMUNITY)
Admission: EM | Admit: 2019-11-30 | Discharge: 2019-12-01 | Disposition: A | Discharge status: Home / Self Care | Payer: Self-pay | Attending: Emergency Medicine | Admitting: Emergency Medicine

## 2019-11-30 ENCOUNTER — Encounter (HOSPITAL_COMMUNITY): Payer: Self-pay

## 2019-11-30 DIAGNOSIS — N83202 Unspecified ovarian cyst, left side: Secondary | ICD-10-CM | POA: Insufficient documentation

## 2019-11-30 DIAGNOSIS — Z79899 Other long term (current) drug therapy: Secondary | ICD-10-CM | POA: Insufficient documentation

## 2019-11-30 DIAGNOSIS — F17210 Nicotine dependence, cigarettes, uncomplicated: Secondary | ICD-10-CM | POA: Insufficient documentation

## 2019-11-30 DIAGNOSIS — I10 Essential (primary) hypertension: Secondary | ICD-10-CM | POA: Insufficient documentation

## 2019-11-30 DIAGNOSIS — R109 Unspecified abdominal pain: Secondary | ICD-10-CM

## 2019-11-30 NOTE — ED Triage Notes (Signed)
Patient arrived with complaints of lower abdominal pain that radiates to her left flank pain since Friday. States she has had some nausea. Denies any OTC medication.   Patient states she was at the [facility] and they did blood and urine tests already but LWBS.

## 2019-12-01 ENCOUNTER — Emergency Department (HOSPITAL_COMMUNITY): Payer: Self-pay

## 2019-12-01 LAB — URINALYSIS, ROUTINE W REFLEX MICROSCOPIC
Bacteria, UA: NONE SEEN
Bilirubin Urine: NEGATIVE
Glucose, UA: NEGATIVE mg/dL
Ketones, ur: NEGATIVE mg/dL
Leukocytes,Ua: NEGATIVE
Nitrite: NEGATIVE
Protein, ur: NEGATIVE mg/dL
Specific Gravity, Urine: 1.013 (ref 1.005–1.030)
pH: 6 (ref 5.0–8.0)

## 2019-12-01 LAB — CBC WITH DIFFERENTIAL/PLATELET
Abs Immature Granulocytes: 0.02 10*3/uL (ref 0.00–0.07)
Basophils Absolute: 0.1 10*3/uL (ref 0.0–0.1)
Basophils Relative: 1 %
Eosinophils Absolute: 0.5 10*3/uL (ref 0.0–0.5)
Eosinophils Relative: 4 %
HCT: 48.9 % — ABNORMAL HIGH (ref 36.0–46.0)
Hemoglobin: 16 g/dL — ABNORMAL HIGH (ref 12.0–15.0)
Immature Granulocytes: 0 %
Lymphocytes Relative: 48 %
Lymphs Abs: 5.8 10*3/uL — ABNORMAL HIGH (ref 0.7–4.0)
MCH: 31.3 pg (ref 26.0–34.0)
MCHC: 32.7 g/dL (ref 30.0–36.0)
MCV: 95.5 fL (ref 80.0–100.0)
Monocytes Absolute: 0.7 10*3/uL (ref 0.1–1.0)
Monocytes Relative: 6 %
Neutro Abs: 4.9 10*3/uL (ref 1.7–7.7)
Neutrophils Relative %: 41 %
Platelets: 328 10*3/uL (ref 150–400)
RBC: 5.12 MIL/uL — ABNORMAL HIGH (ref 3.87–5.11)
RDW: 13.4 % (ref 11.5–15.5)
WBC: 12.1 10*3/uL — ABNORMAL HIGH (ref 4.0–10.5)
nRBC: 0 % (ref 0.0–0.2)

## 2019-12-01 LAB — COMPREHENSIVE METABOLIC PANEL
ALT: 15 U/L (ref 0–44)
AST: 13 U/L — ABNORMAL LOW (ref 15–41)
Albumin: 4.1 g/dL (ref 3.5–5.0)
Alkaline Phosphatase: 90 U/L (ref 38–126)
Anion gap: 8 (ref 5–15)
BUN: 9 mg/dL (ref 6–20)
CO2: 25 mmol/L (ref 22–32)
Calcium: 8.6 mg/dL — ABNORMAL LOW (ref 8.9–10.3)
Chloride: 105 mmol/L (ref 98–111)
Creatinine, Ser: 0.86 mg/dL (ref 0.44–1.00)
GFR calc Af Amer: >60 mL/min (ref >60)
GFR calc non Af Amer: >60 mL/min (ref >60)
Glucose, Bld: 103 mg/dL — ABNORMAL HIGH (ref 70–99)
Potassium: 3.8 mmol/L (ref 3.5–5.1)
Sodium: 138 mmol/L (ref 135–145)
Total Bilirubin: 0.8 mg/dL (ref 0.3–1.2)
Total Protein: 7.4 g/dL (ref 6.5–8.1)

## 2019-12-01 LAB — LIPASE, BLOOD: Lipase: 19 U/L (ref 11–51)

## 2019-12-01 LAB — POC URINE PREG, ED: Preg Test, Ur: NEGATIVE

## 2019-12-01 MED ORDER — NAPROXEN 500 MG PO TABS
500.0000 mg | ORAL_TABLET | Freq: Two times a day (BID) | ORAL | 0 refills | Status: AC
Start: 2019-12-01 — End: 2019-12-08

## 2019-12-01 MED ORDER — SODIUM CHLORIDE 0.9 % IV BOLUS
1000.0000 mL | Freq: Once | INTRAVENOUS | Status: AC
  Administered 2019-12-01: 1000 mL via INTRAVENOUS

## 2019-12-01 MED ORDER — ONDANSETRON HCL 4 MG/2ML IJ SOLN
4.0000 mg | Freq: Once | INTRAMUSCULAR | Status: AC
  Administered 2019-12-01: 4 mg via INTRAVENOUS
  Filled 2019-12-01: qty 2

## 2019-12-01 NOTE — ED Provider Notes (Signed)
[facility]-EMERGENCY DEPT Provider Note   CSN: [id number] Arrival date & time: 11/30/19  2158     History Chief Complaint  Patient presents with  . Abdominal Pain    Natasha Day is a 54 y.o. female.  HPI   Pt is a 54 y/o female with a h/o anxiety, HTN, who presents to the ED today for eval of abd pain that started about 6 days ago. Pain has been intermittent. Pain located to the lower abd and radiates to the left flank. She reports nausea, but denies vomiting, diarrhea, constipation. Reports associated urinary frequency, but denies dysuria. She is still passing flatus.  Denies any vaginal discharge, vaginal bleeding, or concern for STD.  Past Medical History:  Diagnosis Date  . Anxiety   . Hypertension     Patient Active Problem List   Diagnosis Date Noted  . Heart palpitations 08/29/2018  . Essential hypertension 08/29/2018    History reviewed. No pertinent surgical history.   OB History   No obstetric history on file.     Family History  Problem Relation Age of Onset  . Heart attack Mother   . High blood pressure Mother   . Arthritis Father   . Sleep apnea Maternal Grandmother   . Heart attack Maternal Grandmother   . High blood pressure Maternal Grandmother     Social History   Tobacco Use  . Smoking status: Current Every Day Smoker    Packs/day: 2.00    Types: Cigarettes  . Smokeless tobacco: Never Used  Substance Use Topics  . Alcohol use: Yes    Comment: occasional beer or liquor  . Drug use: Not Currently    Home Medications Prior to Admission medications   Medication Sig Start Date End Date Taking? Authorizing Provider  amLODipine (NORVASC) 10 MG tablet Take 10 mg by mouth daily.    Yes [provider]  hydrOXYzine (ATARAX/VISTARIL) 25 MG tablet Take 25 mg by mouth every 8 (eight) hours as needed for anxiety or vomiting.    Yes [provider]  naproxen (NAPROSYN) 500 MG tablet Take 1 tablet (500 mg  total) by mouth 2 (two) times daily for 7 days. 12/01/19 12/08/19  Sorrel Cassetta S, PA-C    Allergies    Erythromycin, Gabapentin, Norco [hydrocodone-acetaminophen], Penicillins, Shellfish allergy, Iodinated diagnostic agents, Methocarbamol, and Sulfa antibiotics  Review of Systems   Review of Systems  Constitutional: Negative for fever.  HENT: Negative for ear pain and sore throat.   Eyes: Negative for visual disturbance.  Respiratory: Negative for cough and shortness of breath.   Cardiovascular: Negative for chest pain.  Gastrointestinal: Positive for abdominal pain and nausea. Negative for constipation, diarrhea and vomiting.  Genitourinary: Positive for frequency. Negative for dysuria and hematuria.  Musculoskeletal: Negative for back pain.  Skin: Negative for rash.  Neurological: Negative for headaches.  All other systems reviewed and are negative.   Physical Exam Updated Vital Signs BP (!) 141/103   Pulse 73   Temp 98.2 F (36.8 C) (Oral)   Resp 16   SpO2 95%   Physical Exam Vitals and nursing note reviewed.  Constitutional:      General: She is not in acute distress.    Appearance: She is well-developed.  HENT:     Head: Normocephalic and atraumatic.  Eyes:     Conjunctiva/sclera: Conjunctivae normal.  Cardiovascular:     Rate and Rhythm: Normal rate and regular rhythm.     Heart sounds: Normal heart sounds.  No murmur heard.   Pulmonary:     Effort: Pulmonary effort is normal. No respiratory distress.     Breath sounds: Normal breath sounds. No wheezing, rhonchi or rales.  Abdominal:     General: Bowel sounds are normal.     Palpations: Abdomen is soft.     Tenderness: There is abdominal tenderness in the suprapubic area and left lower quadrant. There is no right CVA tenderness, left CVA tenderness, guarding or rebound.  Musculoskeletal:     Cervical back: Neck supple.  Skin:    General: Skin is warm and dry.  Neurological:     Mental Status: She is alert.      ED Results / Procedures / Treatments   Labs (all labs ordered are listed, but only abnormal results are displayed) Labs Reviewed  CBC WITH DIFFERENTIAL/PLATELET - Abnormal; Notable for the following components:      Result Value   WBC 12.1 (*)    RBC 5.12 (*)    Hemoglobin 16.0 (*)    HCT 48.9 (*)    Lymphs Abs 5.8 (*)    All other components within normal limits  COMPREHENSIVE METABOLIC PANEL - Abnormal; Notable for the following components:   Glucose, Bld 103 (*)    Calcium 8.6 (*)    AST 13 (*)    All other components within normal limits  URINALYSIS, ROUTINE W REFLEX MICROSCOPIC - Abnormal; Notable for the following components:   Hgb urine dipstick SMALL (*)    All other components within normal limits  LIPASE, BLOOD  POC URINE PREG, ED    EKG EKG Interpretation  Date/Time:  Tuesday November 30 2019 22:19:59 EDT Ventricular Rate:  80 PR Interval:    QRS Duration: 86 QT Interval:  429 QTC Calculation: 495 R Axis:   -10 Text Interpretation: Sinus rhythm Confirmed by Nicanor Alcon, April ([id number]) on 12/01/2019 2:51:30 AM   Radiology CT ABDOMEN PELVIS WO CONTRAST  Result Date: 12/01/2019 CLINICAL DATA:  Five days of lower abdominal pain with nausea radiating to left flank. Microhematuria EXAM: CT ABDOMEN AND PELVIS WITHOUT CONTRAST TECHNIQUE: Multidetector CT imaging of the abdomen and pelvis was performed following the standard protocol without IV contrast. COMPARISON:  CT 06/03/2017 FINDINGS: Lower chest: Lung bases are clear. Normal heart size. No pericardial effusion. Hepatobiliary: No visible liver lesions on this unenhanced CT. Smooth surface contour. Normal hepatic attenuation. Post cholecystectomy. No significant biliary ductal dilatation. No visible calcified intraductal gallstones. Pancreas: Unremarkable. No pancreatic ductal dilatation or surrounding inflammatory changes. Spleen: Normal in size without focal abnormality. Adrenals/Urinary Tract: Normal adrenal glands.  Kidneys are symmetric in size and normally located. No urolithiasis or hydronephrosis. No visible or contour deforming renal lesions. No bladder abnormality. Stomach/Bowel: Distal esophagus, stomach and duodenal sweep are unremarkable. No small bowel wall thickening or dilatation. No evidence of obstruction. A normal appendix is visualized. Scattered colonic diverticula without focal inflammation to suggest diverticulitis. No colonic dilatation or wall thickening. Vascular/Lymphatic: Atherosclerotic calcifications within the abdominal aorta and branch vessels. No aneurysm or ectasia. No enlarged abdominopelvic lymph nodes. Reproductive: Anteverted uterus. 2.6 cm fluid attenuation cyst in the left adnexa (3/65), simple appearing. No follow-up imaging is recommended. Other: No abdominopelvic free fluid or free gas. No bowel containing hernias. Mild soft tissue stranding in the anterior subcutaneous tissues of the abdominal wall, correlate for injectable use. Musculoskeletal: No acute osseous abnormality or suspicious osseous lesion. Multilevel degenerative changes are present in the imaged portions of the spine. Stable grade 1 anterolisthesis L4 on  5. IMPRESSION: 1. No acute intra-abdominal process to provide cause for patient's symptoms. Specifically, no urolithiasis or hydronephrosis. 2. Colonic diverticulosis without evidence of diverticulitis. 3. Mild soft tissue stranding in the anterior subcutaneous tissues of the abdominal wall, correlate for injectable use. 4. Aortic Atherosclerosis (ICD10-I70.0). Electronically Signed   By: Kreg Shropshire M.D.   On: 12/01/2019 03:27    Procedures Procedures (including critical care time)  Medications Ordered in ED Medications  sodium chloride 0.9 % bolus 1,000 mL (0 mLs Intravenous Stopped 12/01/19 0238)  ondansetron (ZOFRAN) injection 4 mg (4 mg Intravenous Given 12/01/19 0107)    ED Course  I have reviewed the triage vital signs and the nursing notes.  Pertinent  labs & imaging results that were available during my care of the patient were reviewed by me and considered in my medical decision making (see chart for details).    MDM Rules/Calculators/A&P                          54 year old female presenting for evaluation of abdominal pain ongoing for the last 6 days.  Located to left lower quadrant.  Associated with nausea.  Vital signs reassuring.  Nontoxic, nonseptic appearing. CBC shows mild leukocytosis and elevated hemoglobin at 16 CMP with normal LFTs and kidney functions Lipase negative Pregnancy test negative UA with hematuria but otherwise no evidence for infection  CT abdomen/pelvis 1. No acute intra-abdominal process to provide cause for patient's symptoms. Specifically, no urolithiasis or hydronephrosis. 2. Colonic diverticulosis without evidence of diverticulitis. 3. Mild soft tissue stranding in the anterior subcutaneous tissues of the abdominal wall, correlate for injectable use. 4. Aortic Atherosclerosis  On reassessment patient states she is feeling somewhat improved.  She has had no episodes of vomiting while in the ED.  Repeat abdominal exam does not show any peritoneal signs, rebound or guarding.  She does not have any evidence of cellulitis to the abdomen.  Denies using any injectable medications to the abdomen.  Suspect symptoms may be related to her adnexal cyst.  I have very low suspicion for torsion given patient is so well-appearing and has no concerning findings on CT scan to suggest this.  Have advised her to follow-up with OB/GYN.  Advised on return precautions.  She voices understanding of the plan and reasons to return.  All questions answered.  Patient stable for discharge.   Final Clinical Impression(s) / ED Diagnoses Final diagnoses:  Abdominal pain, unspecified abdominal location  Left ovarian cyst    Rx / DC Orders ED Discharge Orders         Ordered    naproxen (NAPROSYN) 500 MG tablet  2 times daily      Discontinue  Reprint     12/01/19 0401           Karrie Meres, PA-C 12/01/19 0412    Palumbo, April, MD 12/01/19 0938

## 2019-12-01 NOTE — Discharge Instructions (Signed)
You may alternate taking Tylenol and Naproxen as needed for pain control. You may take Naproxen twice daily as directed on your discharge paperwork and you may take  [phone] mg of Tylenol every 6 hours. Do not exceed 4000 mg of Tylenol daily as this can lead to liver damage. Also, make sure to take Naproxen with meals as it can cause an upset stomach. Do not take other NSAIDs while taking Naproxen such as (Aleve, Ibuprofen, Aspirin, Celebrex, etc) and do not take more than the prescribed dose as this can lead to ulcers and bleeding in your GI tract. You may use warm and cold compresses to help with your symptoms.   Please follow up with an OB-GYN within the next 7-10 days for re-evaluation and further treatment of your symptoms.   Please return to the ER sooner if you have any new or worsening symptoms.

## 2020-03-02 ENCOUNTER — Telehealth: Payer: Self-pay | Admitting: *Deleted

## 2020-03-02 NOTE — Telephone Encounter (Signed)
A message was left, re: her follow up visit. 

## 2020-03-09 ENCOUNTER — Ambulatory Visit: Payer: Self-pay | Admitting: Cardiology

## 2020-03-13 ENCOUNTER — Ambulatory Visit: Payer: Self-pay | Admitting: Cardiology

## 2020-03-20 ENCOUNTER — Encounter: Payer: Self-pay | Admitting: Cardiology

## 2021-06-11 IMAGING — CT CT ABD-PELV W/O CM
2 of 4 series · 16 of 46 positions shown, 18 images · non-contrast
Comparison: CT 06/03/2017

CLINICAL DATA: Five days of lower abdominal pain with nausea
radiating to left flank. Microhematuria

EXAM:
CT ABDOMEN AND PELVIS WITHOUT CONTRAST
TECHNIQUE: Multidetector CT imaging of the abdomen and pelvis was performed
following the standard protocol without IV contrast.

[Series 3: axial st · axial · 0.70mm/px · z∈[-340,+50]mm · 13 of 88 slices shown, 15 images]
[im 5/88  soft-tissue]
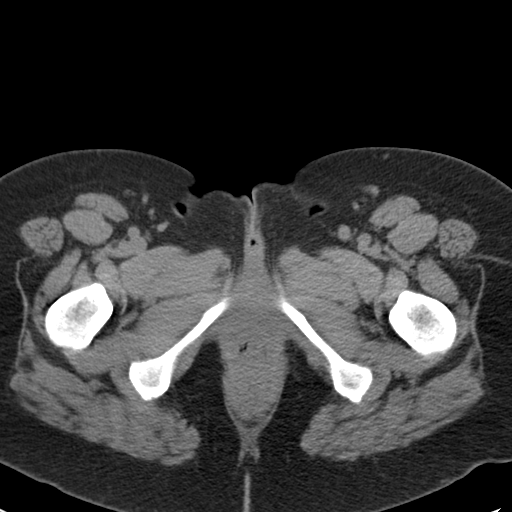
[im 5/88  bone]
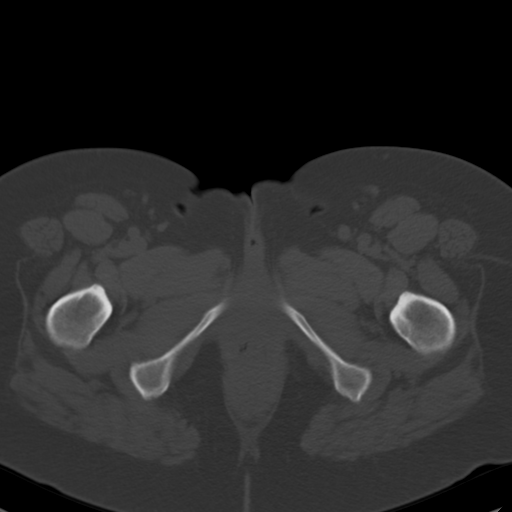
[im 14/88  soft-tissue]
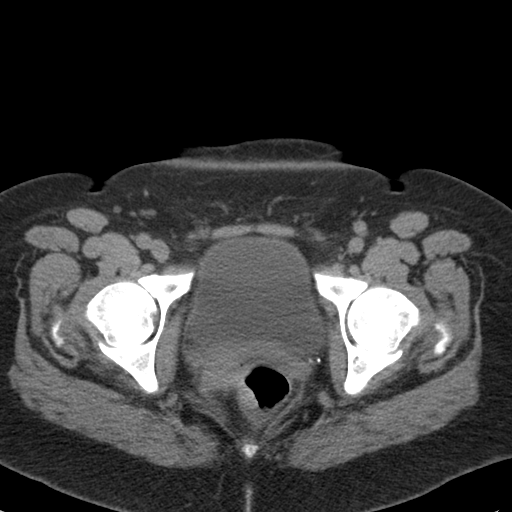
[im 19/88  soft-tissue]
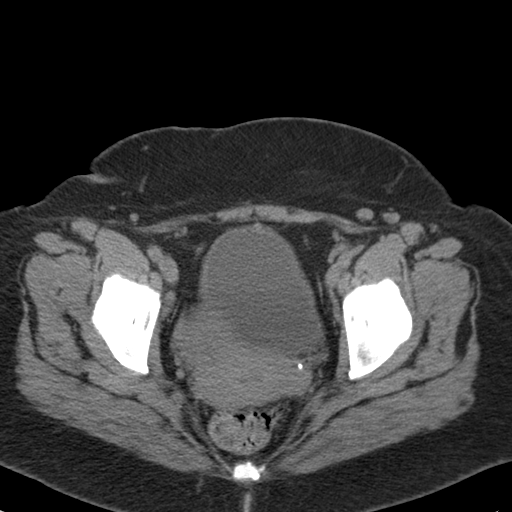
[im 23/88  soft-tissue]
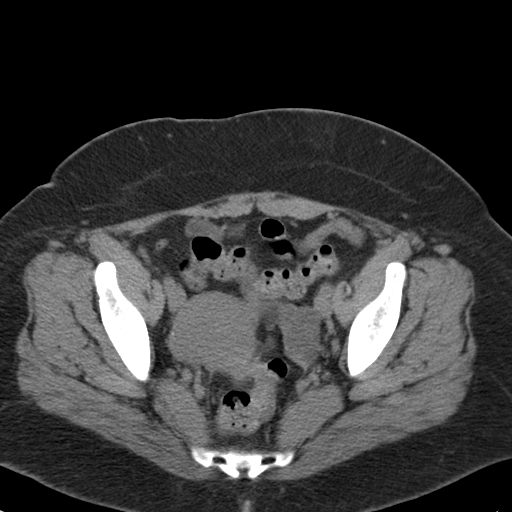
[im 33/88  soft-tissue]
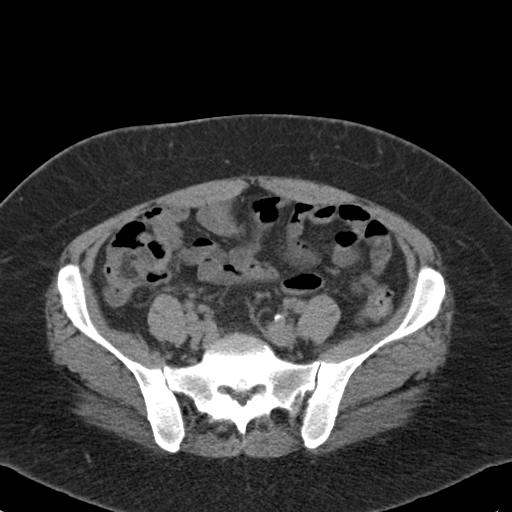
[im 37/88  soft-tissue]
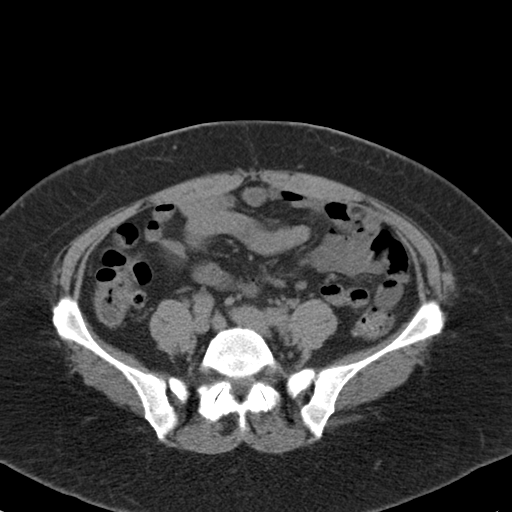
[im 46/88  soft-tissue]
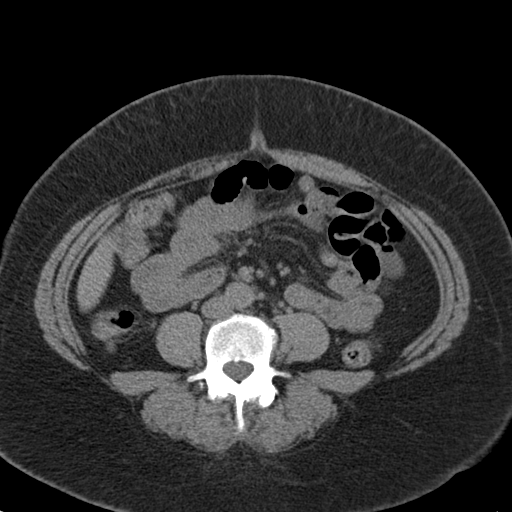
[im 51/88  soft-tissue]
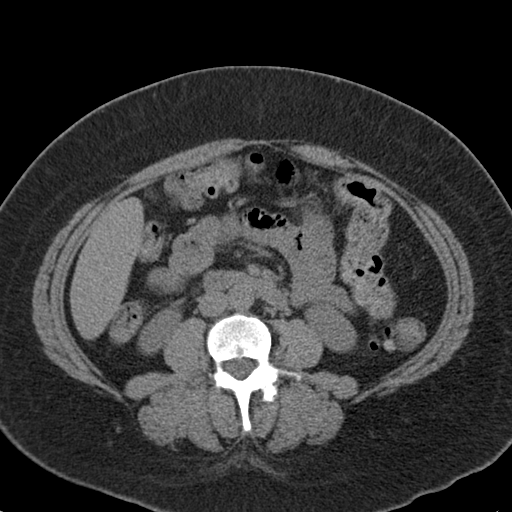
[im 55/88  soft-tissue]
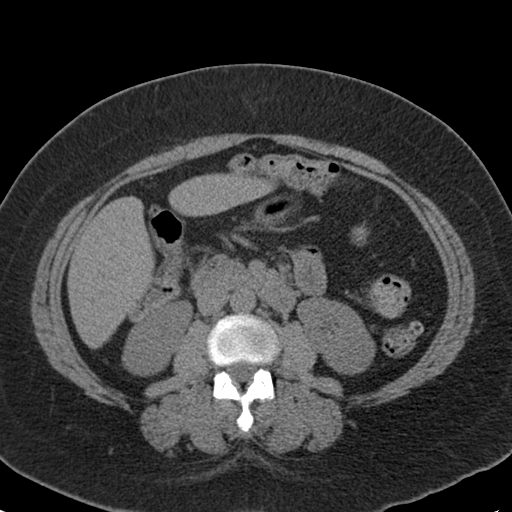
[im 55/88  bone]
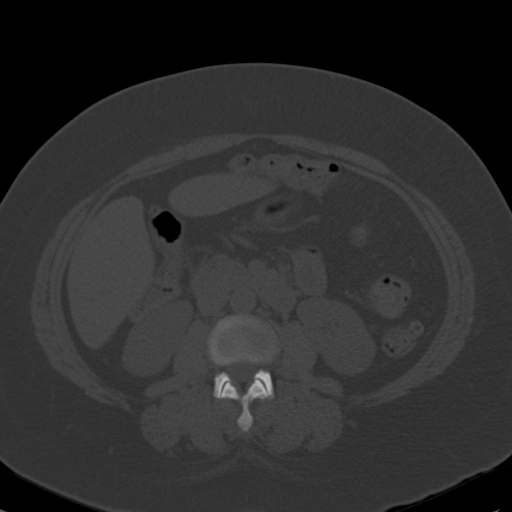
[im 65/88  soft-tissue]
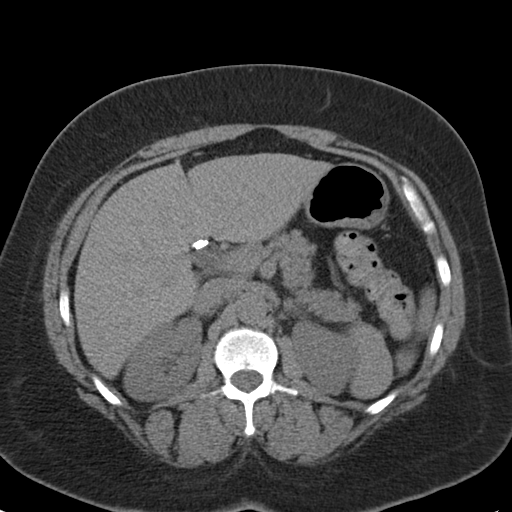
[im 69/88  soft-tissue]
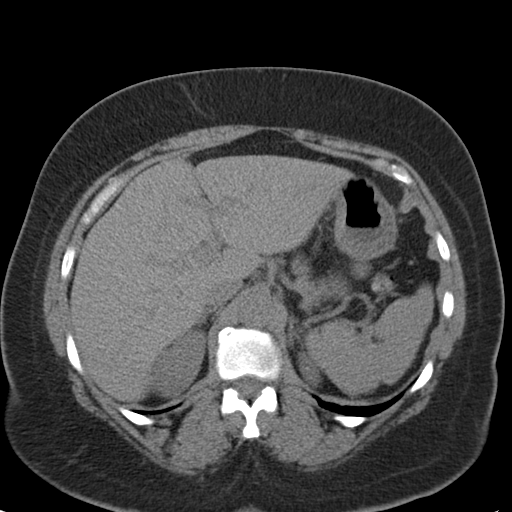
[im 74/88  soft-tissue]
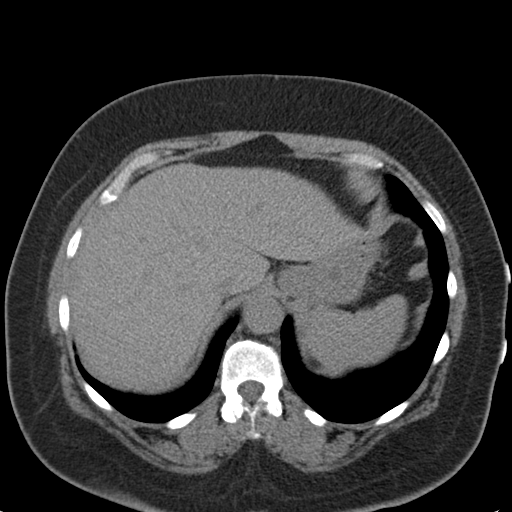
[im 83/88  soft-tissue]
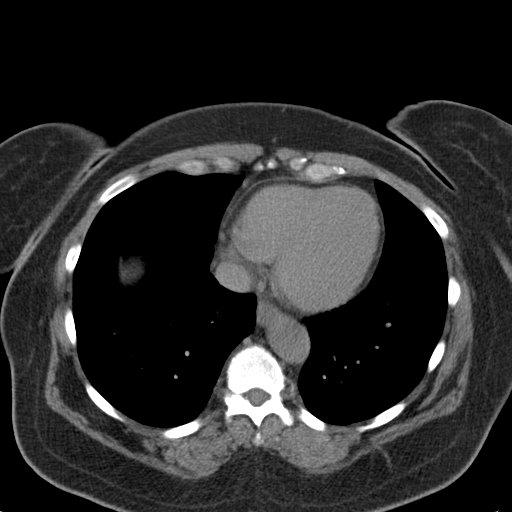

[Series 5: coronal st · coronal · 0.74mm/px · 3 of 155 slices shown]
[im 52/155  soft-tissue]
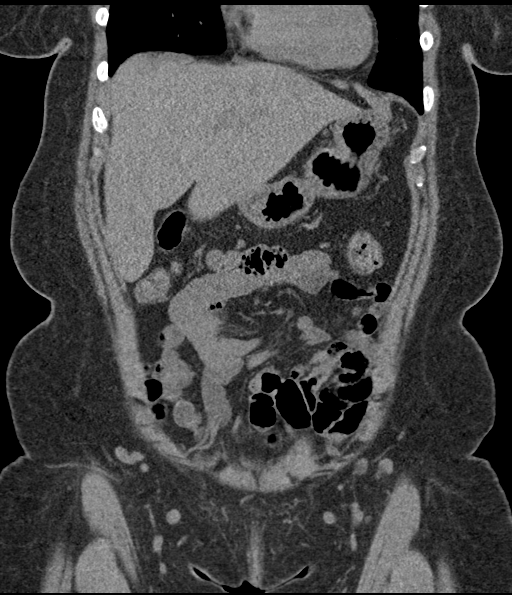
[im 69/155  soft-tissue]
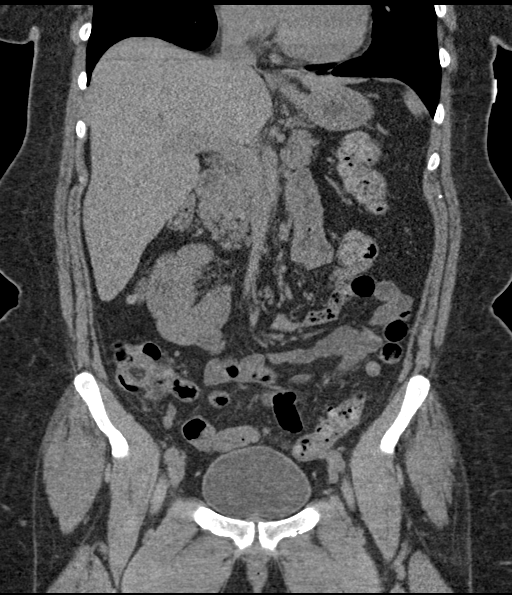
[im 86/155  soft-tissue]
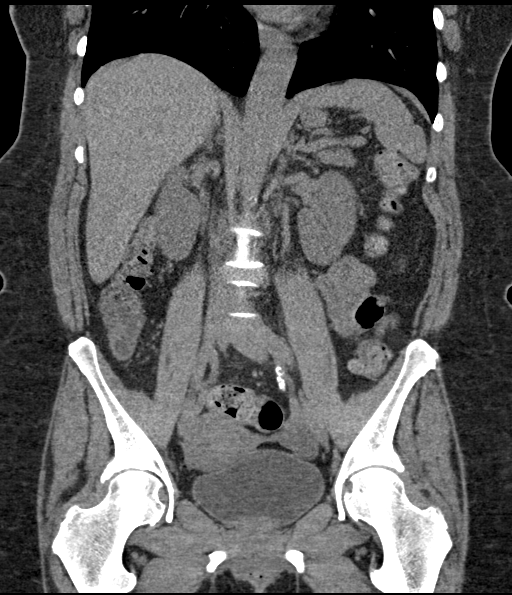

[16 of 46 positions shown; findings below may reference images not displayed]

FINDINGS: Lower chest: Lung bases are clear. Normal heart size. No pericardial
effusion.

Hepatobiliary: No visible liver lesions on this unenhanced CT.
Smooth surface contour. Normal hepatic attenuation. Post
cholecystectomy. No significant biliary ductal dilatation. No
visible calcified intraductal gallstones.

Pancreas: Unremarkable. No pancreatic ductal dilatation or
surrounding inflammatory changes.

Spleen: Normal in size without focal abnormality.

Adrenals/Urinary Tract: Normal adrenal glands. Kidneys are symmetric
in size and normally located. No urolithiasis or hydronephrosis. No
visible or contour deforming renal lesions. No bladder abnormality.

Stomach/Bowel: Distal esophagus, stomach and duodenal sweep are
unremarkable. No small bowel wall thickening or dilatation. No
evidence of obstruction. A normal appendix is visualized. Scattered
colonic diverticula without focal inflammation to suggest
diverticulitis. No colonic dilatation or wall thickening.

Vascular/Lymphatic: Atherosclerotic calcifications within the
abdominal aorta and branch vessels. No aneurysm or ectasia. No
enlarged abdominopelvic lymph nodes.

Reproductive: Anteverted uterus. 2.6 cm fluid attenuation cyst in
the left adnexa (3/65), simple appearing. No follow-up imaging is
recommended.

Other: No abdominopelvic free fluid or free gas. No bowel containing
hernias. Mild soft tissue stranding in the anterior subcutaneous
tissues of the abdominal wall, correlate for injectable use.

Musculoskeletal: No acute osseous abnormality or suspicious osseous
lesion. Multilevel degenerative changes are present in the imaged
portions of the spine. Stable grade 1 anterolisthesis L4 on 5.
IMPRESSION: 1. No acute intra-abdominal process to provide cause for patient's
symptoms. Specifically, no urolithiasis or hydronephrosis.
2. Colonic diverticulosis without evidence of diverticulitis.
3. Mild soft tissue stranding in the anterior subcutaneous tissues
of the abdominal wall, correlate for injectable use.
4. Aortic Atherosclerosis (V1GET-M29.9).

## 2021-12-06 ENCOUNTER — Encounter

## 2021-12-28 ENCOUNTER — Ambulatory Visit: Payer: MEDICAID | Primary: Family

## 2022-05-07 NOTE — ED Provider Notes (Signed)
Formatting of this note is different from the original.  Images from the original note were not included.  History  No chief complaint on file.    Katherine Mcintosh is a 56 year old female that presents to the ED with complaint of chest pain.  Reports ongoing chest pain for about 3 days.  Patient states she also has bilateral shoulder pain and back pain.  Has a history of back and neck pain.  Currently on methadone for history of drug abuse.  States she also has constipation due to the methadone use.  Has been taking medication at home with no results.  Denies any cardiac history but states her mother died of a stroke in her dad from heart failure.     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 No / Unsure    Do you have any of the following new or worsening symptoms? None of these    Have you traveled internationally or domestically in the last month? No         Travel History   Travel since 04/07/22    No documented travel since 04/07/22      Family History   Problem Relation Age of Onset    Heart disease Mother     Alzheimer's disease Mother     Heart disease Father     Breast cancer Neg Hx       Social History     Substance and Sexual Activity   Drug Use Yes    Types: Cocaine, Heroin, Marijuana     Social History     Tobacco Use    Smoking status: Every Day     Types: Cigarettes    Smokeless tobacco: Never   Substance Use Topics    Alcohol use: No    Drug use: Yes     Types: Cocaine, Heroin, Marijuana     Review of Systems   Constitutional:  Negative for chills and fever.   HENT:  Negative for ear pain.    Eyes:  Negative for pain.   Respiratory:  Negative for shortness of breath.  Cardiovascular:  Positive for chest pain/discomfort.   Gastrointestinal:  Negative for abdominal pain, diarrhea, nausea and vomiting.   Genitourinary:  Negative for flank pain.   Musculoskeletal:  Negative for back pain.   Skin:  Negative for rash.   Neurological:  Negative for headaches.   Psychiatric/Behavioral:  Negative for sleep disturbance and suicidal ideas.      Physical Exam  ED Triage Vitals [05/07/22 1224]   Temp Heart Rate Resp BP SpO2   36.8 C (98.3 F) 67 18 (!) 140/98 96 %     Temp Source Heart Rate Source Patient Position BP Location FiO2 (%)   Oral -- -- -- --     Vitals:    05/07/22 1224   BP: (!) 140/98   Pulse: 67   Resp: 18   Temp: 36.8 C (98.3 F)   TempSrc: Oral   SpO2: 96%   Weight: 69.9 kg (154 lb)   Height: 162.6 cm (5\' 4" )     Physical Exam  Vitals and nursing note reviewed.   Constitutional:       General: She is not in acute distress.     Appearance: Normal appearance.   HENT:      Head: Normocephalic and atraumatic.      Nose: Nose normal. No congestion.   Eyes:      Conjunctiva/sclera: Conjunctivae normal.      Pupils: Pupils are equal, round, and reactive to light.   Cardiovascular:      Rate and Rhythm: Normal rate and regular rhythm.      Pulses: Normal pulses.           Carotid pulses are 2+ on the right side and 2+ on the left side.       Radial pulses are 2+ on the right side and 2+ on the left side.      Heart sounds: Normal heart sounds.   Pulmonary:      Effort: Pulmonary effort is  normal.      Breath sounds: Normal breath sounds.   Abdominal:      General: Abdomen is flat. Bowel sounds are normal. There is no distension.      Palpations: Abdomen is soft.      Tenderness: There is no abdominal tenderness.   Musculoskeletal:         General: No signs of injury.      Cervical back: Normal range of motion and neck supple.   Skin:     General: Skin is warm and dry.      Capillary Refill: Capillary refill takes less than 2 seconds.   Neurological:      General: No focal deficit present.      Mental Status: She is alert and oriented to person, place, and time.   Psychiatric:         Mood and Affect: Mood normal.         Behavior: Behavior normal.             Procedure  Procedures    Re-Evaluation  Re-Evaluation    ED Course  Clinical Impressions as of 05/07/22 1419   Drug-induced constipation     MDM  Medical Decision Making  Plan of care - xray chest and abdomen, labs, urine, EKG    Differential diagnosis - STEMI, NSTEMI, constipation, pancreatitis, electrolyte abnormalities, pneumonia, PE    Amount and/or Complexity of Data Reviewed  Labs: ordered. Decision-making details documented in ED Course.  Radiology: ordered. Decision-making details documented in ED Course.  ECG/medicine tests: ordered.    1:31 AM   I, 14/12/23 -scribe, documented on behalf of Dr. Chaney Malling.     Katherine Mcintosh is a 56 y.o. female presenting to  the ED for chief complaint of chest pain.     Dr. Maye Hides personally saw and evaluated the patient, discussed the management with Doris Cheadle (APP), and reviewed the APP note and agrees with the documentation. Dr. Maye Hides performed a substantive portion of the physical exam    Exam findings as follows:    Constitutional: Awake and alert   HENT: Head normocephalic and atraumatic   Eyes: Conjunctiva unremarkable   Cardiovascular: Heart rate regular   Pulmonary: effort normal, no respiratory distress  Abdominal:  non-distended, no visible mass  Skin: Warm and dry    Musculoskeletal: Moving all extremities spontaneously   Neurological: No focal deficit      Plan of Care: Agreeable with care of plan of Amber Fisher (NP).      Attestation    Provider Statement  Evaluation and management services were performed by the NP/PA-C under my supervision/collaboration with my participation. I have reviewed all pertinent clinical information, including history, physical exam and plan.  Provider Statement:  By electronically signing this emergency patient record, the Emergency Physician/NP/PA-C attests that all entries made into the electronic medical record by the scribe prior to the Physician/NP/PA-C signature reflect an accurate accounting of the evaluation and care rendered by that Emergency Physician/NP/PA-C. The Emergency Physician/NP/PA-C assumes full responsibility for those entries.         Doris Cheadle, APRN-CNP  05/07/22 1258      Chaney Malling  05/07/22 1419      Amber Fisher, APRN-CNP  05/07/22 2042    Electronically signed by Doris Cheadle, APRN-CNP at 05/07/2022  8:42 PM EST

## 2022-06-28 ENCOUNTER — Ambulatory Visit: Payer: MEDICAID | Primary: Family

## 2023-02-14 ENCOUNTER — Emergency Department (HOSPITAL_BASED_OUTPATIENT_CLINIC_OR_DEPARTMENT_OTHER): Payer: 59

## 2023-02-14 ENCOUNTER — Emergency Department (HOSPITAL_BASED_OUTPATIENT_CLINIC_OR_DEPARTMENT_OTHER)
Admission: EM | Admit: 2023-02-14 | Discharge: 2023-02-14 | Disposition: A | Discharge status: Home / Self Care | Payer: 59 | Attending: Emergency Medicine | Admitting: Emergency Medicine

## 2023-02-14 ENCOUNTER — Other Ambulatory Visit: Payer: Self-pay

## 2023-02-14 DIAGNOSIS — R519 Headache, unspecified: Secondary | ICD-10-CM | POA: Diagnosis present

## 2023-02-14 DIAGNOSIS — Z79899 Other long term (current) drug therapy: Secondary | ICD-10-CM | POA: Diagnosis not present

## 2023-02-14 DIAGNOSIS — I10 Essential (primary) hypertension: Secondary | ICD-10-CM | POA: Insufficient documentation

## 2023-02-14 LAB — CBC
HCT: 47.6 % — ABNORMAL HIGH (ref 36.0–46.0)
Hemoglobin: 15.6 g/dL — ABNORMAL HIGH (ref 12.0–15.0)
MCH: 31 pg (ref 26.0–34.0)
MCHC: 32.8 g/dL (ref 30.0–36.0)
MCV: 94.6 fL (ref 80.0–100.0)
Platelets: 351 10*3/uL (ref 150–400)
RBC: 5.03 MIL/uL (ref 3.87–5.11)
RDW: 13.4 % (ref 11.5–15.5)
WBC: 14.9 10*3/uL — ABNORMAL HIGH (ref 4.0–10.5)
nRBC: 0 % (ref 0.0–0.2)

## 2023-02-14 LAB — BASIC METABOLIC PANEL
Anion gap: 9 (ref 5–15)
BUN: 14 mg/dL (ref 6–20)
CO2: 25 mmol/L (ref 22–32)
Calcium: 8.7 mg/dL — ABNORMAL LOW (ref 8.9–10.3)
Chloride: 105 mmol/L (ref 98–111)
Creatinine, Ser: 1.14 mg/dL — ABNORMAL HIGH (ref 0.44–1.00)
GFR, Estimated: 56 mL/min — ABNORMAL LOW (ref >60)
Glucose, Bld: 96 mg/dL (ref 70–99)
Potassium: 3.9 mmol/L (ref 3.5–5.1)
Sodium: 139 mmol/L (ref 135–145)

## 2023-02-14 MED ORDER — PROCHLORPERAZINE EDISYLATE 10 MG/2ML IJ SOLN
10.0000 mg | Freq: Once | INTRAMUSCULAR | Status: AC
  Administered 2023-02-14: 10 mg via INTRAVENOUS
  Filled 2023-02-14: qty 2

## 2023-02-14 MED ORDER — KETOROLAC TROMETHAMINE 15 MG/ML IJ SOLN
15.0000 mg | Freq: Once | INTRAMUSCULAR | Status: AC
  Administered 2023-02-14: 15 mg via INTRAVENOUS
  Filled 2023-02-14: qty 1

## 2023-02-14 NOTE — ED Triage Notes (Signed)
Patient presents to ED via POV from home. Here with headache that began yesterday. Reports visual changes "seeing floaters" to both eyes. Reports symptoms were gradual. Endorses nausea. Also reports pain to entire left side of body. Denies recent injury/fall/trauma.

## 2023-02-14 NOTE — ED Provider Notes (Signed)
[facility] Provider Note   CSN: [id number] Arrival date & time: 02/14/23  1422     History  Chief Complaint  Patient presents with   Headache    Natasha Day is a 57 y.o. female.   Headache Patient dents with headache.  On the top of her head.  Goes to left eye.  Has some floaters at times.  States she has now had this headache for months.  Has been seen for it.  Has a history of previous headaches.  States she has not had a head CT since this is started though.  States she also has pain down her left side.  From her upper and lower body.  States she feels of her breast is more swollen.  States she has had a mammogram for the swelling and did not show anything. No photophobia.  No sonophobia.  No trauma.    Past Medical History:  Diagnosis Date   Anxiety    Hypertension     Home Medications Prior to Admission medications   Medication Sig Start Date End Date Taking? Authorizing Provider  amLODipine (NORVASC) 10 MG tablet Take 10 mg by mouth daily.     [provider]  hydrOXYzine (ATARAX/VISTARIL) 25 MG tablet Take 25 mg by mouth every 8 (eight) hours as needed for anxiety or vomiting.     [provider]      Allergies    Erythromycin, Gabapentin, Norco [hydrocodone-acetaminophen], Penicillins, Shellfish allergy, Iodinated contrast media, Methocarbamol, and Sulfa antibiotics    Review of Systems   Review of Systems  Neurological:  Positive for headaches.    Physical Exam Updated Vital Signs BP (!) 133/100 (BP Location: Right Arm)   Pulse 92   Temp 98 F (36.7 C) (Oral)   Resp 20   Ht 5\' 8"  (1.727 m)   Wt 99.3 kg   SpO2 99%   BMI 33.30 kg/m  Physical Exam Vitals reviewed.  HENT:     Head: Atraumatic.  Eyes:     Pupils: Pupils are equal, round, and reactive to light.  Cardiovascular:     Rate and Rhythm: Regular rhythm.  Musculoskeletal:     Cervical back: Normal range of motion and neck  supple.  Skin:    General: Skin is warm.  Neurological:     Mental Status: She is alert.     ED Results / Procedures / Treatments   Labs (all labs ordered are listed, but only abnormal results are displayed) Labs Reviewed  BASIC METABOLIC PANEL  CBC    EKG None  Radiology No results found.  Procedures Procedures    Medications Ordered in ED Medications  ketorolac (TORADOL) 15 MG/ML injection 15 mg (has no administration in time range)  prochlorperazine (COMPAZINE) injection 10 mg (has no administration in time range)    ED Course/ Medical Decision Making/ A&P                                 Medical Decision Making Amount and/or Complexity of Data Reviewed Labs: ordered. Radiology: ordered.  Risk Prescription drug management.   Patient with headache.  History of same.  However states this is different.  More frontal.  No vision changes but does have some floaters at times.  Will get head CT evaluate for causes such intracranial hemorrhage.  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 Will also get basic  blood work.  States left breast is swollen but has seen Dr. Had mammogram.  Do not think we need further workup at this time.  Also pain on her whole left side can be worked up by PCP        Final Clinical Impression(s) / ED Diagnoses Final diagnoses:  None    Rx / DC Orders ED Discharge Orders     None         Benjiman Core, MD 02/14/23 2338

## 2023-02-14 NOTE — ED Notes (Signed)
Reviewed discharge instructions and pt understands to follow up with primary care

## 2023-03-15 NOTE — Telephone Encounter (Signed)
 Called patient she has been see Infectious Disease doc in [place], she will call them to refill

## 2023-04-01 ENCOUNTER — Ambulatory Visit: Payer: 59 | Admitting: Internal Medicine

## 2023-04-15 ENCOUNTER — Ambulatory Visit (INDEPENDENT_AMBULATORY_CARE_PROVIDER_SITE_OTHER): Payer: 59 | Admitting: Internal Medicine

## 2023-04-15 ENCOUNTER — Other Ambulatory Visit: Payer: Self-pay

## 2023-04-15 ENCOUNTER — Encounter: Payer: Self-pay | Admitting: Internal Medicine

## 2023-04-15 VITALS — BP 155/106 | HR 98 | Ht 68.25 in | Wt 231.0 lb

## 2023-04-15 DIAGNOSIS — L281 Prurigo nodularis: Secondary | ICD-10-CM | POA: Insufficient documentation

## 2023-04-15 DIAGNOSIS — F22 Delusional disorders: Secondary | ICD-10-CM | POA: Diagnosis not present

## 2023-04-15 NOTE — Progress Notes (Signed)
[facility]      Reason for Consult: rash    Referring Physician: Elpidio Anis, PA-C    Patient ID: Natasha Day, female    DOB: 09-15-1965, 57 y.o.   MRN: [id number]  HPI:   Natasha Day is here for evaluation of a scalp rash.  She is followed by dermatology for her rash and underwent a biopsy c/w prurigo nodularis.  She was started on appropriate treatment.  She feels that there are parasites in the area.  Has tried antiparasitic treatments already with no improvement.  She shows pictures and videos.  No parasites or eggs seen by dermatology or on biopsy.  She has mutliple videos of her scalp showing her pulling out her hair.  She shows me one video with a hair that moves and feels it represents a parasite.  Videos are largely blurry.    Past Medical History:  Diagnosis Date   Anxiety    Hypertension     Prior to Admission medications   Medication Sig Start Date End Date Taking? Authorizing Provider  amLODipine (NORVASC) 10 MG tablet Take 10 mg by mouth daily.     [provider]  hydrOXYzine (ATARAX/VISTARIL) 25 MG tablet Take 25 mg by mouth every 8 (eight) hours as needed for anxiety or vomiting.     [provider]    Allergies  Allergen Reactions   Erythromycin Hives and Swelling   Gabapentin Other (See Comments)    Made hair fall out   Norco [Hydrocodone-Acetaminophen] Hives and Swelling   Penicillins Hives and Swelling   Shellfish Allergy Hives and Swelling   Iodinated Contrast Media Rash   Methocarbamol Rash   Sulfa Antibiotics Itching and Rash    Social History   Tobacco Use   Smoking status: Every Day    Current packs/day: 2.00    Types: Cigarettes   Smokeless tobacco: Never  Substance Use Topics   Alcohol use: Yes    Comment: occasional beer or liquor   Drug use: Not Currently    Family History  Problem Relation Age of Onset   Heart attack Mother    High blood pressure Mother    Arthritis Father    Sleep  apnea Maternal Grandmother    Heart attack Maternal Grandmother    High blood pressure Maternal Grandmother     Review of Systems  Constitutional: negative for fatigue and malaise All other systems reviewed and are negative    Constitutional: in no apparent distress There were no vitals filed for this visit. EYES: anicteric ENMT: scalp without any irritation, no parasites noted Respiratory: normal respiratory effort   Labs: Lab Results  Component Value Date   WBC 14.9 (H) 02/14/2023   HGB 15.6 (H) 02/14/2023   HCT 47.6 (H) 02/14/2023   MCV 94.6 02/14/2023   PLT 351 02/14/2023    Lab Results  Component Value Date   CREATININE 1.14 (H) 02/14/2023   BUN 14 02/14/2023   NA 139 02/14/2023   K 3.9 02/14/2023   CL 105 02/14/2023   CO2 25 02/14/2023    Lab Results  Component Value Date   ALT 15 12/01/2019   AST 13 (L) 12/01/2019   ALKPHOS 90 12/01/2019   BILITOT 0.8 12/01/2019     Assessment: no parasites seen on scalp or on videos. Most c/w delusional parasitosis.  Specimen cup provided to send to pathology to identify anything she feels is parasitic.    Plan: 1)  bring in  sample in question when available Otherwise no follow up needed

## 2023-04-18 ENCOUNTER — Ambulatory Visit: Payer: 59 | Admitting: Internal Medicine

## 2023-05-15 ENCOUNTER — Ambulatory Visit (INDEPENDENT_AMBULATORY_CARE_PROVIDER_SITE_OTHER): Payer: 59 | Admitting: Internal Medicine

## 2023-05-15 ENCOUNTER — Encounter: Payer: Self-pay | Admitting: Internal Medicine

## 2023-05-15 ENCOUNTER — Telehealth: Payer: Self-pay

## 2023-05-15 ENCOUNTER — Other Ambulatory Visit: Payer: Self-pay

## 2023-05-15 VITALS — BP 117/85 | HR 94 | Temp 97.7°F | Ht 68.0 in | Wt 227.0 lb

## 2023-05-15 DIAGNOSIS — F22 Delusional disorders: Secondary | ICD-10-CM

## 2023-05-15 NOTE — Telephone Encounter (Signed)
Received a call back from [facility] at [facility] pathology who stated that a biopsy from the scalp would need to be done and placed in a formalin container and it has to be done at her pcp office or dermatologist office.

## 2023-05-15 NOTE — Telephone Encounter (Addendum)
[facility] pathology [phone]) to see is they do dermatology pathology from a patients scalp for prurigo nodularis. If they do what order needs to placed and what container is specimen placed in.  Left voice message for Mindi Junker to call me back.

## 2023-05-15 NOTE — Progress Notes (Addendum)
   Subjective:    Patient ID: Natasha Day, female    DOB: December 02, 1965, 57 y.o.   MRN: [id number]  HPI Natasha Day is here for a work in visit. I previously saw her with concern for parasites.  She showed me videos and pictures of hair and did not note any parasites.  I recommended we send a sample to pathology to confirm but she has not yet brought in a sample.  She shows me more videos of more hair that she reports are worms   Review of Systems  Constitutional:  Negative for fatigue.       Objective:   Physical Exam HENT:     Head:     Comments: Patchy areas of hair loss, no parasites noted Neurological:     Mental Status: She is alert.           Assessment & Plan:

## 2023-05-16 ENCOUNTER — Encounter: Payer: Self-pay | Admitting: Internal Medicine

## 2023-05-16 ENCOUNTER — Telehealth: Payer: Self-pay

## 2023-05-16 NOTE — Assessment & Plan Note (Signed)
I again discussed with her that I do not see any worms in the videos or on exam.  I again reiterated that we can send a sample of the hair to pathology to evaluate.   Discussed with pathology after the visit and will need it to be a biopsy.  Long discussion regarding findings with myself and staff.    I have personally spent 42 minutes involved in face-to-face and non-face-to-face activities for this patient on the day of the visit. Professional time spent includes the following activities: Preparing to see the patient (review of tests), Obtaining and/or reviewing separately obtained history (admission/discharge record), Performing a medically appropriate examination and/or evaluation , Ordering medications/tests/procedures, referring and communicating with other health care professionals, Documenting clinical information in the EMR, Independently interpreting results (not separately reported), Communicating results to the patient/family/caregiver, Counseling and educating the patient/family/caregiver and Care coordination (not separately reported).

## 2023-05-16 NOTE — Telephone Encounter (Signed)
Notified patient that she will need to have a biopsy done for prurigo nodularis at either her pcp or dermatologist. Patient verbalized understanding and had no further questions.   No further follow up is needed per Dr. Luciana Axe.

## 2023-06-20 ENCOUNTER — Telehealth: Payer: Self-pay

## 2023-06-20 NOTE — Telephone Encounter (Signed)
Attempted to contact patient in as they requested. Patient had requested a follow up with Dr. Luciana Axe, per last note patient was requested to obtain biopsy testing of scalp at PCP or dermatology. If patient returns call please inform her of this information. No follow up need with Dr Luciana Axe.   Valarie Cones, LPN

## 2024-02-06 NOTE — Telephone Encounter (Signed)
 Contract:  [id number]  Kelly from access to talk with Dr Marchelle regarding pt transfer from [place]. Rn connected to his phone.

## 2024-02-06 NOTE — Telephone Encounter (Signed)
 Numeric page sent to call Access

## 2024-02-06 NOTE — Telephone Encounter (Signed)
 Contract:  TRAre Dr Assi for Renal Abscess

## 2024-02-07 DIAGNOSIS — N10 Acute tubulo-interstitial nephritis: Principal | ICD-10-CM

## 2024-02-08 ENCOUNTER — Inpatient Hospital Stay
Admit: 2024-02-08 | Discharge: 2024-02-13 | Disposition: A | Discharge status: Home / Self Care | Payer: Medicaid (Managed Care) | Source: Other Acute Inpatient Hospital | Attending: Internal Medicine | Admitting: Internal Medicine

## 2024-02-08 DIAGNOSIS — N151 Renal and perinephric abscess: Secondary | ICD-10-CM

## 2024-02-08 LAB — URINALYSIS WITH REFLEX TO CULTURE
Bilirubin, Urine: NEGATIVE
Glucose, Ur: NEGATIVE mg/dL
Ketones, Urine: NEGATIVE mg/dL
Nitrite, Urine: NEGATIVE
Specific Gravity, UA: 1.009 (ref 1.005–1.030)
Urobilinogen, Urine: NORMAL EU/dL (ref 0.0–1.0)
pH, Urine: 7 (ref 5.0–8.0)

## 2024-02-08 LAB — URINE DRUG SCREEN
Amphetamine Screen, Ur: NEGATIVE
Barbiturate Screen, Ur: NEGATIVE
Benzodiazepine Screen, Urine: NEGATIVE
Cannabinoid Scrn, Ur: POSITIVE — AB
Cocaine Metabolite, Urine: POSITIVE — AB
Fentanyl, Ur: NEGATIVE
Methadone Screen, Urine: POSITIVE — AB
Opiates, Urine: POSITIVE — AB
Oxycodone Screen, Ur: POSITIVE — AB
Phencyclidine, Urine: NEGATIVE

## 2024-02-08 LAB — URINALYSIS
Bilirubin, Urine: NEGATIVE
Glucose, Ur: NEGATIVE mg/dL
Ketones, Urine: NEGATIVE mg/dL
Nitrite, Urine: NEGATIVE
Protein, UA: NEGATIVE mg/dL
Specific Gravity, UA: 1.006 (ref 1.005–1.030)
Urobilinogen, Urine: NORMAL EU/dL (ref 0.0–1.0)
pH, Urine: 7.5 (ref 5.0–8.0)

## 2024-02-08 LAB — COMPREHENSIVE METABOLIC PANEL
ALT: 48 U/L — ABNORMAL HIGH (ref 10–35)
AST: 42 U/L — ABNORMAL HIGH (ref 10–35)
Albumin/Globulin Ratio: 1 (ref 1.0–2.5)
Albumin: 3.4 g/dL — ABNORMAL LOW (ref 3.5–5.2)
Alkaline Phosphatase: 68 U/L (ref 35–104)
Anion Gap: 11 mmol/L (ref 9–16)
BUN: 5 mg/dL — ABNORMAL LOW (ref 6–20)
CO2: 26 mmol/L (ref 20–31)
Calcium: 8.6 mg/dL (ref 8.6–10.4)
Chloride: 94 mmol/L — ABNORMAL LOW (ref 98–107)
Creatinine: 0.5 mg/dL — ABNORMAL LOW (ref 0.6–0.9)
Est, Glom Filt Rate: >90 mL/min/1.73m2 (ref >60)
Glucose: 94 mg/dL (ref 74–99)
Potassium: 3.9 mmol/L (ref 3.7–5.3)
Sodium: 131 mmol/L — ABNORMAL LOW (ref 136–145)
Total Bilirubin: 0.4 mg/dL (ref 0.0–1.2)
Total Protein: 6.7 g/dL (ref 6.6–8.7)

## 2024-02-08 LAB — TSH: TSH: 0.87 u[IU]/mL (ref 0.27–4.20)

## 2024-02-08 LAB — MICROSCOPIC URINALYSIS
Casts UA: 2 /LPF (ref 0–8)
Epithelial Cells, UA: 0 /HPF (ref 0–5)
Epithelial Cells, UA: 2 /HPF (ref 0–5)
RBC, UA: 5 /HPF (ref 0–4)
RBC, UA: 2 /HPF (ref 0–4)
WBC, UA: 0 /HPF (ref 0–5)
WBC, UA: 5 /HPF (ref 0–5)

## 2024-02-08 LAB — CBC
Hematocrit: 32.9 % — ABNORMAL LOW (ref 36.3–47.1)
Hemoglobin: 11.1 g/dL — ABNORMAL LOW (ref 11.9–15.1)
MCH: 30.2 pg (ref 25.2–33.5)
MCHC: 33.7 g/dL (ref 28.4–34.8)
MCV: 89.6 fL (ref 82.6–102.9)
MPV: 10.8 fL (ref 8.1–13.5)
NRBC Automated: 0 /100{WBCs}
Platelets: 225 k/uL (ref 138–453)
RBC: 3.67 m/uL — ABNORMAL LOW (ref 3.95–5.11)
RDW: 12.7 % (ref 11.8–14.4)
WBC: 12.8 k/uL — ABNORMAL HIGH (ref 3.5–11.3)

## 2024-02-08 LAB — PROTIME-INR
INR: 1.1
Protime: 14.5 s (ref 11.7–14.9)

## 2024-02-08 MED ORDER — CEFTRIAXONE SODIUM 1 G IJ SOLR
1 | INTRAMUSCULAR | Status: AC
Start: 2024-02-08 — End: 2024-02-12
  Administered 2024-02-09 – 2024-02-10 (×2): 1000 mg via INTRAVENOUS

## 2024-02-08 MED ORDER — NORMAL SALINE FLUSH 0.9 % IV SOLN
0.9 | Freq: Two times a day (BID) | INTRAVENOUS | Status: DC
Start: 2024-02-08 — End: 2024-02-13
  Administered 2024-02-08 – 2024-02-13 (×4): 10 mL via INTRAVENOUS

## 2024-02-08 MED ORDER — ACETAMINOPHEN 650 MG RE SUPP
650 | Freq: Four times a day (QID) | RECTAL | Status: DC | PRN
Start: 2024-02-08 — End: 2024-02-13

## 2024-02-08 MED ORDER — ACETAMINOPHEN 325 MG PO TABS
325 | Freq: Four times a day (QID) | ORAL | Status: DC | PRN
Start: 2024-02-08 — End: 2024-02-13
  Administered 2024-02-08: 20:00:00 325 mg via ORAL
  Administered 2024-02-10 – 2024-02-13 (×3): 650 mg via ORAL

## 2024-02-08 MED ORDER — VANCOMYCIN INTERMITTENT DOSING (PLACEHOLDER)
INTRAVENOUS | Status: DC
Start: 2024-02-08 — End: 2024-02-13

## 2024-02-08 MED ORDER — ONDANSETRON 4 MG PO TBDP
4 | Freq: Three times a day (TID) | ORAL | Status: DC | PRN
Start: 2024-02-08 — End: 2024-02-13
  Administered 2024-02-09 – 2024-02-12 (×2): 4 mg via ORAL

## 2024-02-08 MED ORDER — OXYCODONE-ACETAMINOPHEN 5-325 MG PO TABS
5-325 | ORAL | Status: DC | PRN
Start: 2024-02-08 — End: 2024-02-12
  Administered 2024-02-08 – 2024-02-12 (×18): 1 via ORAL

## 2024-02-08 MED ORDER — ENOXAPARIN SODIUM 40 MG/0.4ML IJ SOSY
40 | Freq: Every day | INTRAMUSCULAR | Status: DC
Start: 2024-02-08 — End: 2024-02-13
  Administered 2024-02-12 – 2024-02-13 (×2): 40 mg via SUBCUTANEOUS

## 2024-02-08 MED ORDER — POLYETHYLENE GLYCOL 3350 17 G PO PACK
17 | Freq: Every day | ORAL | Status: DC | PRN
Start: 2024-02-08 — End: 2024-02-10

## 2024-02-08 MED ORDER — SODIUM CHLORIDE 0.9 % IV SOLN
0.9 | INTRAVENOUS | Status: DC | PRN
Start: 2024-02-08 — End: 2024-02-13

## 2024-02-08 MED ORDER — ONDANSETRON HCL 4 MG/2ML IJ SOLN
4 | Freq: Four times a day (QID) | INTRAMUSCULAR | Status: DC | PRN
Start: 2024-02-08 — End: 2024-02-13
  Administered 2024-02-08 – 2024-02-13 (×12): 4 mg via INTRAVENOUS

## 2024-02-08 MED ORDER — SODIUM CHLORIDE 0.9 % IV SOLN
0.9 | INTRAVENOUS | Status: DC
Start: 2024-02-08 — End: 2024-02-13
  Administered 2024-02-08 – 2024-02-13 (×8): via INTRAVENOUS

## 2024-02-08 MED ORDER — NORMAL SALINE FLUSH 0.9 % IV SOLN
0.9 | INTRAVENOUS | Status: DC | PRN
Start: 2024-02-08 — End: 2024-02-13
  Administered 2024-02-09: 10 mL via INTRAVENOUS

## 2024-02-08 MED ORDER — VANCOMYCIN HCL 1.25 G IV SOLR
1.25 | Freq: Two times a day (BID) | INTRAVENOUS | Status: DC
Start: 2024-02-08 — End: 2024-02-13
  Administered 2024-02-09 – 2024-02-13 (×10): 1250 mg via INTRAVENOUS

## 2024-02-08 MED FILL — OXYCODONE-ACETAMINOPHEN 5-325 MG PO TABS: 5-325 mg | ORAL | Qty: 1 | Fill #0

## 2024-02-08 MED FILL — CEFTRIAXONE SODIUM 1 G IJ SOLR: 1 g | INTRAMUSCULAR | Qty: 1000 | Fill #0

## 2024-02-08 MED FILL — VANCOMYCIN HCL 1.25 G IV SOLR: 1.25 g | INTRAVENOUS | Qty: 1250 | Fill #0

## 2024-02-08 MED FILL — ONDANSETRON HCL 4 MG/2ML IJ SOLN: 4 MG/2ML | INTRAMUSCULAR | Qty: 2 | Fill #0

## 2024-02-08 MED FILL — MIRALAX 17 G PO PACK: 17 g | ORAL | Qty: 1 | Fill #0

## 2024-02-08 MED FILL — ACETAMINOPHEN 325 MG PO TABS: 325 mg | ORAL | Qty: 1 | Fill #0

## 2024-02-08 NOTE — Consults (Cosign Needed)
 Drs. Joeline, Zafar, Santacroce, Khan, Mostafa, Castalia, Hollister.  Urology Consult      Patient:  Katherine Mcintosh  MRN: [id number]  Date of birth: 09/11/1965    CHIEF COMPLAINT: Right renal abscess    HISTORY OF PRESENT ILLNESS:   The patient is a 58 y.o. female with no significant past medical history who presented to [facility] with severe right sided abdominal and flank pain.  A CT abdomen and pelvis did reveal a 2.5 cm fluid collection within the parenchyma of the right kidney consistent with a renal abscess, accompanied by some perinephric stranding.  Her urinalysis was negative for WBC.  On evaluation, the patient denies any history of diabetes or immunocompromise state.  She denies any IV drug use, however, her urine tox screen was positive for cocaine, THC, opiates.  Additionally she does have a history of hepatitis C. She is afebrile, has a low grade fever to 99.  Her urine culture from [facility] is growing Staph aureus, no blood cultures obtained.    Patient's old records, notes and chart reviewed and summarized above.    Past Medical History:    Past Medical History:   Diagnosis Date    Bipolar 1 disorder (HCC)        Past Surgical History:    No past surgical history on file.    Medications:      Current Facility-Administered Medications:     vancomycin  (VANCOCIN ) intermittent dosing (placeholder), , Other, RX Placeholder, York Lenis, MD    vancomycin  (VANCOCIN ) 1,250 mg in sodium chloride  0.9 % 250 mL IVPB (Vial2Bag), 1,250 mg, IntraVENous, Q12H, York Lenis, MD    0.9 % sodium chloride  infusion, , IntraVENous, Continuous, Jagarlamudi, Kalyan Chakravarthy, MD, Last Rate: 125 mL/hr at 02/08/24 0521, Rate Verify at 02/08/24 0521    sodium chloride  flush 0.9 % injection 5-40 mL, 5-40 mL, IntraVENous, 2 times per day, Jagarlamudi, Kalyan Chakravarthy, MD, 10 mL at 02/07/24 2033    sodium chloride  flush 0.9 % injection 5-40 mL, 5-40 mL, IntraVENous, PRN, Jagarlamudi, Kalyan Chakravarthy, MD     0.9 % sodium chloride  infusion, , IntraVENous, PRN, Jagarlamudi, Kalyan Chakravarthy, MD    [facility] by provider] enoxaparin  (LOVENOX ) injection 40 mg, 40 mg, SubCUTAneous, Daily, Jagarlamudi, Kalyan Chakravarthy, MD    ondansetron  (ZOFRAN -ODT) disintegrating tablet 4 mg, 4 mg, Oral, Q8H PRN **OR** ondansetron  (ZOFRAN ) injection 4 mg, 4 mg, IntraVENous, Q6H PRN, Jagarlamudi, Kalyan Chakravarthy, MD, 4 mg at 02/07/24 2033    acetaminophen  (TYLENOL ) tablet 650 mg, 650 mg, Oral, Q6H PRN **OR** acetaminophen  (TYLENOL ) suppository 650 mg, 650 mg, Rectal, Q6H PRN, Jagarlamudi, Kalyan Chakravarthy, MD    polyethylene glycol (GLYCOLAX ) packet 17 g, 17 g, Oral, Daily PRN, Jagarlamudi, Kalyan Chakravarthy, MD    cefTRIAXone  (ROCEPHIN ) 1,000 mg in sterile water  10 mL IV syringe, 1,000 mg, IntraVENous, Q24H, Jagarlamudi, Kalyan Chakravarthy, MD    oxyCODONE -acetaminophen  (PERCOCET ) 5-325 MG per tablet 1 tablet, 1 tablet, Oral, Q4H PRN, Carren Mathilda CHRISTELLA, APRN - CNP, 1 tablet at 02/08/24 0731    Allergies:    Allergies   Allergen Reactions    Penicillins Hives       Social History:   Social History     Socioeconomic History    Marital status: Unknown     Spouse name: Not on file    Number of children: Not on file    Years of education: Not on file    Highest education level: Not on file   Occupational History  Not on file   Tobacco Use    Smoking status: Every Day     Current packs/day: 0.50     Types: Cigarettes     Passive exposure: Current    Smokeless tobacco: Not on file    Tobacco comments:     Current smoker: 0.5 pack/day x 25 yrs per pt   Substance and Sexual Activity    Alcohol use: Not on file    Drug use: Yes     Types: Cocaine     Comment: pt states using cocaine 3 days ago    Sexual activity: Not on file   Other Topics Concern    Not on file   Social History Narrative    Not on file     Social Drivers of Health     Financial Resource Strain: Medium Risk (09/30/2022)    Received from [facility]    Overall  Financial Resource Strain (CARDIA)     Difficulty of Paying Living Expenses: Somewhat hard   Food Insecurity: No Food Insecurity (02/07/2024)    Hunger Vital Sign     Worried About Running Out of Food in the Last Year: Never true     Ran Out of Food in the Last Year: Never true   Transportation Needs: No Transportation Needs (02/07/2024)    PRAPARE - Therapist, art (Medical): No     Lack of Transportation (Non-Medical): No   Physical Activity: Sufficiently Active (11/19/2017)    Received from [facility]    Exercise Vital Sign     Days of Exercise per Week: 6 days     Minutes of Exercise per Session: 60 min   Stress: Stress Concern Present (11/19/2017)    Received from [facility]    [employer] of Occupational Health - Occupational Stress Questionnaire     Feeling of Stress : Rather much   Social Connections: Moderately Integrated (11/19/2017)    Received from [facility]    Social Connection and Isolation Panel     Frequency of Communication with Friends and Family: More than three times a week     Frequency of Social Gatherings with Friends and Family: Three times a week     Attends Religious Services: More than 4 times per year     Active Member of Clubs or Organizations: Yes     Attends Banker Meetings: More than 4 times per year     Marital Status: Divorced   Intimate Partner Violence: Not At Risk (08/07/2022)    Received from [employer] [facility] [employer], Afraid, Rape, and Kick questionnaire     Within the last year, have you been afraid of your partner or ex-partner?: No     Within the last year, have you been humiliated or emotionally abused in other ways by your partner or ex-partner?: No     Within the last year, have you been kicked, hit, slapped, or otherwise physically hurt by your partner or ex-partner?: No     Within the last year, have you been raped or forced to have any kind of sexual activity by your partner  or ex-partner?: No   Housing Stability: Low Risk  (02/07/2024)    Housing Stability Vital Sign     Unable to Pay for Housing in the Last Year: No     Number of Times Moved in the Last Year: 0     Homeless  in the Last Year: No       Family History:  No family history on file.    REVIEW OF SYSTEMS:  A comprehensive 14 point review of systems was obtained.  Constitutional: No fatigue  Eyes: No blurry vision  Ears, nose, mouth, throat, face: No ringing in the ears; no facial droop.  Respiratory: No cough or cold.  Cardiovascular: No palpitations  Gastrointestinal: No diarrhea or constipation.  Genitourinary: No burning with urination  Integument/Skin: No rashes  Hematologic/Lymphatic: No easy bruising  Musculoskeletal: No muscle pains  Neurologic: No weakness in the extremities.   Psychiatric: No depression or suicidal thoughts.  Endocrine: No heat or cold intolerances.  Allergic/Immunologic: No current seasonal allergies; no skin hives.      Physical Exam:    This a 58 y.o. Female     Constitutional: Patient in no acute distress.  Neuro: alert and oriented to person place and time.  HEENT: No acute abnormalities  Lungs: Respiratory effort normal on room air  Cardiovascular:  Heart rate regular rate and rhythm  Abdomen: Soft, non-tender, non-distended. Non peritonitic  Extremities: No leg swelling, no edema  GU: Right CVA tenderness    Labs:  Recent Labs     02/07/24  2046   WBC 12.8*   HGB 11.1*   HCT 32.9*   MCV 89.6   PLT 225     Recent Labs     02/07/24  2046   NA 131*   K 3.9   CL 94*   CO2 26   BUN 5*   CREATININE 0.5*       Recent Labs     02/07/24  2206   COLORU Yellow   PHUR 7.5   WBCUA 0 TO 2   RBCUA 5 TO 10   BACTERIA None   LEUKOCYTESUR TRACE*   UROBILINOGEN Normal   BILIRUBINUR NEGATIVE       Culture Results:  Urine culture Staph aureus from [place], no sensitivities yet  Blood cultures pending      -----------------------------------------------------------------  Imaging Results:  Outside CT from [facility], in PACS    Assessment and Plan   Impression:  58 year old female with 2.5 cm right renal abscess.  She does have significant risk factors for hematogenous spread.  She denies IV drug use but does have a history of hepatitis C, urine tox was positive for opioids, THC, cocaine.  Urine culture from [facility] growing Staph aureus.  She is currently on vancomycin  and Rocephin , blood cultures pending.  Will manage conservatively at this time given the fact that the abscess is under 3 cm and she is not immunocompromised.    Plan:  - Regular diet for now, manage right renal abscess conservatively  - Will treat with vancomycin  and Rocephin , follow-up on final urine culture from [facility] as well as blood cultures from this hospital  - Will plan to reimage in 24 to 48 hours  - Trend vitals and labs including daily CBC, BMP      Thank you for involving us  in the care of Estalene M Cecere. Should you have any questions, please do not hesitate to contact us  at any time.    Alm Cool, MD  Urology Resident, PGY-5

## 2024-02-08 NOTE — H&P (Signed)
 [facility]  Office: [phone]  Katherine Fitz, DO, Katherine Gula, DO, Katherine Dust, DO, Katherine Blood, DO, Katherine Oppenheim, MD, Katherine Sago, MD, Katherine Meter, MD, Katherine Gill, MD,  Katherine Shams, MD, Katherine Bathe, MD, Katherine Waqar, MD,  Katherine Clam, DO, Katherine Fitz, DO, Katherine Cordial, MD,  Katherine Kemps, DO, Katherine Medico, MD, Katherine Burner, MD, Katherine Willy, MD,  Katherine Jagarlamudi, MD, Katherine Muller, MD, Katherine Coca, MD, Katherine Odea, MD, Katherine Kays, MD, Consepcion Heady, MD, Katherine Hole, DO, Katherine Buss, MD, Katherine Fairly, DO, Katherine Barefoot, MD, Katherine Lennox, MD, Katherine Blossom, MD, Katherine Blossom, MD, Katherine Bjork, MD, Katherine Bart, CNP,  Katherine Gavel, CNP, Katherine Cooks, CNP,  Katherine Nicely, DNP, Katherine Pang, CNP, Katherine Herald, CNP, Katherine King, CNP, Katherine Lass, CNP, Katherine Acre, PA-C, Katherine Miester, CNP, Katherine Pouch, CNP,  Katherine Meager, CNP, Katherine Ingle, CNP, Katherine Magnuson, PA-C, Katherine Harbor, PA-C, Katherine Dam, CNP, Katherine Ada, CNP,  Katherine Mcintosh, CNS, Katherine Server, CNP, Katherine Sink, CNP         [facility]   IN-PATIENT SERVICE   [facility]    HISTORY AND PHYSICAL EXAMINATION            Date:   02/08/2024  Patient name:  Katherine Mcintosh  Date of admission:  02/07/2024  8:18 PM  MRN:   [id number]  Account:  [id number]  Date of Birth:  1965-10-24  PCP:    Katherine Slough, APRN - CNP  Room:   0345/0345-02  Code Status:    Full Code      History Obtained From:     Patient    History of Present Illness:     EDDY Mcintosh is a 58 y.o. Unavailable / unknown female who presents with No chief complaint on file.   and is admitted to the hospital for the management of Acute pyelonephritis.  Has a history of bipolar, prior of history of drug abuse, she is on methadone  program.  She has a history of bipolar, drug use, on methadone , h/o treated latent TB  Presented to [facility] hospital due to right flank pain, she was found  to have right sided renal abscess, she was transferred to our hospital for urology procedure.  ROS, severe nausea, vomiting, diarrhea, right abd pain, difficulty passing urine, fever, chills for 4 days.  Admit of smoking cracks as she lost her ride to methadone  clinic for 11 days, complains of having rash on her lips.  Admitted of having new sexual partner with whom she was having normal and oral intercourse.    Past Medical History:     Past Medical History:   Diagnosis Date    Bipolar 1 disorder (HCC)         Past Surgical History:     No past surgical history on file.     Medications Prior to Admission:     Prior to Admission medications   Medication Sig Start Date End Date Taking? Authorizing Provider   risperiDONE (RISPERDAL PO) Take by mouth Unknown dose and frequency per pt, pt not taking   Yes [provider]   OXcarbazepine (TRILEPTAL PO) Take by mouth Unknown dose and frequency, pt not taking   Yes [provider]        Allergies:     Penicillins    Physical Exam:   BP 100/69   Pulse 53   Temp 99 F (37.2 C) (Oral)   Resp 16   Ht 1.651  m (5' 5)   Wt 65.7 kg (144 lb 13.5 oz)   SpO2 95%   BMI 24.10 kg/m   Temp (24hrs), Avg:99.2 F (37.3 C), Min:99 F (37.2 C), Max:99.3 F (37.4 C)    No results for input(s): POCGLU in the last 72 hours.    Intake/Output Summary (Last 24 hours) at 02/08/2024 0959  Last data filed at 02/08/2024 9478  Gross per 24 hour   Intake 773.49 ml   Output 1650 ml   Net -876.51 ml       General Appearance:  alert, well appearing, and in no acute distress  Mental status: oriented to person, place, and time  Head:  normocephalic, atraumatic  Eye: no icterus, redness, pupils equal and reactive, extraocular eye movements intact, conjunctiva clear  Ear: normal external ear, no discharge, hearing intact  Nose:  no drainage noted  Mouth: mucous membranes moist, rash around the mouth, cold sores noted in the mouth.  Neck: supple, no carotid bruits, thyroid not  palpable  Lungs: Bilateral equal air entry, clear to ausculation, no wheezing, rales or rhonchi, normal effort  Cardiovascular: normal rate, regular rhythm, no murmur, gallop, rub  Abdomen: Soft, nontender, nondistended, normal bowel sounds, no hepatomegaly or splenomegaly  Neurologic: There are no new focal motor or sensory deficits, normal muscle tone and bulk, no abnormal sensation, normal speech, cranial nerves II through XII grossly intact  Skin: No gross lesions, rashes, bruising or bleeding on exposed skin area  Extremities:  peripheral pulses palpable, no pedal edema or calf pain with palpation  Psych: irritable, talkative.     Investigations:      Laboratory Testing:  Recent Results (from the past 24 hours)   CBC    Collection Time: 02/07/24  8:46 PM   Result Value Ref Range    WBC 12.8 (H) 3.5 - 11.3 k/uL    RBC 3.67 (L) 3.95 - 5.11 m/uL    Hemoglobin 11.1 (L) 11.9 - 15.1 g/dL    Hematocrit 67.0 (L) 36.3 - 47.1 %    MCV 89.6 82.6 - 102.9 fL    MCH 30.2 25.2 - 33.5 pg    MCHC 33.7 28.4 - 34.8 g/dL    RDW 87.2 88.1 - 85.5 %    Platelets 225 138 - 453 k/uL    MPV 10.8 8.1 - 13.5 fL    NRBC Automated 0.0 0.0 per 100 WBC   Comprehensive Metabolic Panel    Collection Time: 02/07/24  8:46 PM   Result Value Ref Range    Sodium 131 (L) 136 - 145 mmol/L    Potassium 3.9 3.7 - 5.3 mmol/L    Chloride 94 (L) 98 - 107 mmol/L    CO2 26 20 - 31 mmol/L    Anion Gap 11 9 - 16 mmol/L    Glucose 94 74 - 99 mg/dL    BUN 5 (L) 6 - 20 mg/dL    Creatinine 0.5 (L) 0.6 - 0.9 mg/dL    Est, Glom Filt Rate >90 >60 mL/min/1.69m2    Calcium 8.6 8.6 - 10.4 mg/dL    Total Protein 6.7 6.6 - 8.7 g/dL    Albumin 3.4 (L) 3.5 - 5.2 g/dL    Albumin/Globulin Ratio 1.0 1.0 - 2.5    Total Bilirubin 0.4 0.0 - 1.2 mg/dL    Alkaline Phosphatase 68 35 - 104 U/L    ALT 48 (H) 10 - 35 U/L    AST 42 (H) 10 - 35 U/L  TSH    Collection Time: 02/07/24  8:46 PM   Result Value Ref Range    TSH 0.87 0.27 - 4.20 uIU/mL   Urinalysis    Collection Time: 02/07/24  10:06 PM   Result Value Ref Range    Color, UA Yellow Yellow    Turbidity UA Clear Clear    Glucose, Ur NEGATIVE NEGATIVE mg/dL    Bilirubin, Urine NEGATIVE NEGATIVE    Ketones, Urine NEGATIVE NEGATIVE mg/dL    Specific Gravity, UA 1.006 1.005 - 1.030    Urine Hgb SMALL (A) NEGATIVE    pH, Urine 7.5 5.0 - 8.0    Protein, UA NEGATIVE NEGATIVE mg/dL    Urobilinogen, Urine Normal 0.0 - 1.0 EU/dL    Nitrite, Urine NEGATIVE NEGATIVE    Leukocyte Esterase, Urine TRACE (A) NEGATIVE   Microscopic Urinalysis    Collection Time: 02/07/24 10:06 PM   Result Value Ref Range    WBC, UA 0 TO 2 0 - 5 /HPF    RBC, UA 5 TO 10 0 - 4 /HPF    Casts UA  0 - 8 /LPF     None Reference range defined for non-centrifuged specimen.    Epithelial Cells, UA 0 TO 2 0 - 5 /HPF    Bacteria, UA None None   Protime-INR    Collection Time: 02/08/24  7:37 AM   Result Value Ref Range    Protime 14.5 11.7 - 14.9 sec    INR 1.1        Imaging/Diagnostics:  No results found.    Assessment :      Hospital Problems           Last Modified POA    * (Principal) Acute pyelonephritis 02/07/2024 Yes       Plan:   Renal abscess  Evaluated by urology who are planning for conservative treatment now  IV ABX  F/U Mcintosh and urine culture.  ID consult  F/u IR tmw regarding intervention.    Bipolar disorder,   Not on meds  States she self medicate  Will get psych consult  Social worker consult    H/o drug abuse,   On methadone  program, states her dose is 120 mg.    Mouth rash,   States it has started recently post crack smoking.  Could be vasculitis vs herpes  The patient states that she is using Abreva cream.      H/o hepatitis C  Not treated due to low level.          Patient is admitted as inpatient status because of co-morbidities listed above, severity of signs and symptoms as outlined, requirement for current medical therapies and most importantly because of direct risk to patient if care not provided in a hospital setting.  Expected length of stay > 48 hours.  Patient is admitted in the Med/Surge    Medical Decision Making: Medium    Wynell Lash, MD  02/08/2024  9:59 AM    Copy sent to Dr. Jerilynn Slough, APRN - CNP

## 2024-02-08 NOTE — Consults (Signed)
 Department of Psychiatry  Consult Service   Psychiatric Assessment        REASON FOR CONSULT: History of bipolar/drug use/not taking medications    CONSULTING PHYSICIAN: Hiba Ali    History obtained from: Nursing staff, EMR and patient    HISTORY OF PRESENT ILLNESS:          The patient is a 58 y.o. female with significant psychiatric history of bipolar disorder and polysubstance abuse who presents to the hospital after presenting to [facility] ER with severe right sided abdominal and flank pain.  A CT abdomen and pelvis did reveal some fluid collection within the parenchyma of the right kidney consistent with a renal abscess.  She is currently admitted medically for management of pyelonephritis.  Per ER documentation patient's urine drug screen was positive for cocaine, THC, and opiates, however this writer is unable to view the urine drug screen.  Did reorder urine drug screen for our records and also check to see if patient is positive for methadone .  Patient reportedly takes Methadone  however per patient and per documentation she lost her ride to the methadone  clinic and had not been taking it for the past 11 days.  Psychiatry was consulted due to history of bipolar disorder.    This writer attempted to assess patient at bedside.  Introduced self and role. Patient immediately dismissed Clinical research associate and stated I don't need to talk to you. I take care of that stuff outpatient.  Attempted to explain reasons for consult as well as to help manage medications and she again stated, no thank you.  Attempted to ask patient about current medications and she states, I don't take anything and I don't want to take anything.  Asked patient about her methadone  and she states that she took a dose this morning.  However later in the conversation she explains that she had lost her ride and had not been able to take her Methadone  the past 11 days.  Attempted to ask patient how she dosed this morning then and she states, I shouldn't  have said that, scratch that, don't write that down.  Due to noncompliance with assessment interview is terminated.    Unsure if patient is truly taking her methadone  however if she is she is not using it appropriately if urine drug screen is positive for THC, Cocaine and Opiates. She has no active prescriptions per review of [facility].  Would be very hesistant to restart any methadone  at this time.  She reportedly gets it from [facility], would call them tomorrow to confirm dose amount as well as last time she was in to take her methadone .  She does deny both homicidal and suicidal ideation.  She does not require admission to [facility].  Psychiatry will sign off at this time.   Urine drug screen should screen for methadone .       The patient is not currently receiving care for the above psychiatric illness.    Past Psychiatric History:  Prior Diagnosis: Bipolar Disorder  Outpatient psychiatric provider: Denies  Psychiatric Hospitalization: no  Hx of Suicidal Attempts: no  Hx of violence:  no    Past psychiatric medications includes:   Per review of records:  Risperdal, Wellbutrin, Methadone ,    Adverse reactions from psychotropic medications:  Denies    Substance Abuse History:  ETOH: Denies  Marijuana: urine drug screen positive  Opiates: urine drug screen positive  Other Drugs: urine drug screen positive for cocaine    Social History:  Unable to assess due to patient's unwillingness to cooperate with assessment    Past Medical History:        Diagnosis Date    Bipolar 1 disorder [facility])        Past Surgical History:    No past surgical history on file.    Family Medical and Psychiatric History:     Unable to assess due to patient's unwillingness to cooperate with assessment    No family history on file.    Medications Prior to Admission:   Medications Prior to Admission: risperiDONE (RISPERDAL PO), Take by mouth Unknown dose and frequency per pt, pt not taking  OXcarbazepine (TRILEPTAL PO), Take by mouth  Unknown dose and frequency, pt not taking    Allergies:  Penicillins    [facility] Psychiatric Review of Systems  Unable to assess due to patient's unwillingness to cooperate with assessment        Prior to Admission medications   Medication Sig Start Date End Date Taking? Authorizing Provider   risperiDONE (RISPERDAL PO) Take by mouth Unknown dose and frequency per pt, pt not taking   Yes [provider]   OXcarbazepine (TRILEPTAL PO) Take by mouth Unknown dose and frequency, pt not taking   Yes [provider]        Medications:    Current Facility-Administered Medications: vancomycin  (VANCOCIN ) intermittent dosing (placeholder), , Other, RX Placeholder  vancomycin  (VANCOCIN ) 1,250 mg in sodium chloride  0.9 % 250 mL IVPB (Vial2Bag), 1,250 mg, IntraVENous, Q12H  0.9 % sodium chloride  infusion, , IntraVENous, Continuous  sodium chloride  flush 0.9 % injection 5-40 mL, 5-40 mL, IntraVENous, 2 times per day  sodium chloride  flush 0.9 % injection 5-40 mL, 5-40 mL, IntraVENous, PRN  0.9 % sodium chloride  infusion, , IntraVENous, PRN  [Held by provider] enoxaparin  (LOVENOX ) injection 40 mg, 40 mg, SubCUTAneous, Daily  ondansetron  (ZOFRAN -ODT) disintegrating tablet 4 mg, 4 mg, Oral, Q8H PRN **OR** ondansetron  (ZOFRAN ) injection 4 mg, 4 mg, IntraVENous, Q6H PRN  acetaminophen  (TYLENOL ) tablet 650 mg, 650 mg, Oral, Q6H PRN **OR** acetaminophen  (TYLENOL ) suppository 650 mg, 650 mg, Rectal, Q6H PRN  polyethylene glycol (GLYCOLAX ) packet 17 g, 17 g, Oral, Daily PRN  cefTRIAXone  (ROCEPHIN ) 1,000 mg in sterile water  10 mL IV syringe, 1,000 mg, IntraVENous, Q24H  oxyCODONE -acetaminophen  (PERCOCET ) 5-325 MG per tablet 1 tablet, 1 tablet, Oral, Q4H PRN     Physical:    Vitals:  BP 100/69   Pulse 53   Temp 99 F (37.2 C) (Oral)   Resp 16   Ht 1.651 m (5' 5)   Wt 65.7 kg (144 lb 13.5 oz)   SpO2 95%   BMI 24.10 kg/m        Neuro Exam:   Muscle Strength & Tone: full ROM    Involuntary Movements: No    Mental  Status Examination:  Level of consciousness:  Awake and alert  Appearance: hospital attire, lying in bed, fair grooming  Behavior/Motor:  no abnormalities noted  Attitude toward examiner:  noncooperative  Speech:  Spontaneous, normal rate and volume  Mood:  irritable  Affect: mood congruent  Thought processes:  Linear, goal directed, coherent  Thought content: Denies suicidal ideations   Denies homicidal ideations    Denies hallucinations   Denies delusions  Cognition:  Oriented to self, situation, location, date  Concentration clinically adequate  Memory age appropriate  Insight & Judgment:  fair    DSM-5 DIAGNOSIS:      Acute Stress Reaction  Stressors     Severity of stressors is mild  Source of stressors include:  Other psychosocial and environmental stressors    Plan:    Patient does not require [facility] admission at this time  Patient not interested in starting any psychiatric medications  Would confirm methadone  dose and last time she dosed with [facility] when they open tomorrow morning.  It does not appear she is taking her Methadone  appropriately  Psychiatry will sign off at this time.  Additional recommendations will follow the clinical course.       Thank you very much for allowing us  to participate in the care of this patient.      Electronically signed by Tinnie Mort, APRN - CNP on 02/08/24 at 12:11 PM EDT    Please note that this chart was generated using voice recognition Dragon dictation software.  Although every effort was made to ensure the accuracy of this automated transcription, some errors in transcription may have occurred.

## 2024-02-08 NOTE — Plan of Care (Signed)
 Problem: Chronic Conditions and Co-morbidities  Goal: Patient's chronic conditions and co-morbidity symptoms are monitored and maintained or improved  02/08/2024 1819 by Charmain Bouchard, RN  Outcome: Progressing  02/08/2024 0559 by Jaquita Mayo, RN  Outcome: Progressing     Problem: Pain  Goal: Verbalizes/displays adequate comfort level or baseline comfort level  02/08/2024 1819 by Charmain Bouchard, RN  Outcome: Progressing  02/08/2024 0559 by Jaquita Mayo, RN  Outcome: Progressing     Problem: Genitourinary - Adult  Goal: Absence of urinary retention  02/08/2024 1819 by Charmain Bouchard, RN  Outcome: Progressing  02/08/2024 0559 by Jaquita Mayo, RN  Outcome: Progressing     Problem: Infection - Adult  Goal: Absence of infection at discharge  02/08/2024 1819 by Charmain Bouchard, RN  Outcome: Progressing  02/08/2024 0559 by Jaquita Mayo, RN  Outcome: Progressing

## 2024-02-08 NOTE — Care Coordination-Inpatient (Signed)
 Case Management Assessment  Initial Evaluation    Date/Time of Evaluation: 02/08/2024 10:05 AM  Assessment Completed by: Erminio Bonine, RN    If patient is discharged prior to next notation, then this note serves as note for discharge by case management.    Patient Name: Katherine Mcintosh                   Date of Birth: 1966-05-22  Diagnosis: Acute pyelonephritis [N10]  Renal abscess [N15.1]                   Date / Time: 02/07/2024  8:18 PM    Patient Admission Status: Inpatient   Readmission Risk (Low < 19, Mod (19-27), High > 27): Readmission Risk Score: 6.4    Current PCP: Jerilynn Slough, APRN - CNP  PCP verified by CM? (P) No (does not have one currently)    Chart Reviewed: Yes      History Provided by: (P) Patient  Patient Orientation: (P) Alert and Oriented, Person, Place, Situation, Self    Patient Cognition: (P) Alert    Hospitalization in the last 30 days (Readmission):  No    If yes, Readmission Assessment in CM Navigator will be completed.    Advance Directives:      Code Status: Full Code   Patient's Primary Decision Maker is: (P) Legal Next of Kin      Discharge Planning:    Patient lives with: (P) Family Members Type of Home: (P) Other (Comment) (2nd floor duplex)  Primary Care Giver: (P) Self  Patient Support Systems include: (P) Children, Family Members, Friends/Neighbors   Current Financial resources: (P) Medicaid  Current community resources: (P) None  Current services prior to admission: (P) None            Current DME:  none            Type of Home Care services:  (P) None    ADLS  Prior functional level: (P) Independent in ADLs/IADLs  Current functional level: (P) Independent in ADLs/IADLs    PT AM-PAC:   /24  OT AM-PAC:   /24    Family can provide assistance at DC: (P) No  Would you like Case Management to discuss the discharge plan with any other family members/significant others, and if so, who? (P) No  Plans to Return to Present Housing: (P) Yes  Other Identified Issues/Barriers to  RETURNING to current housing: none  Potential Assistance needed at discharge: (P) N/A            Potential DME:  none  Patient expects to discharge to: (P) Other (comment) (2nd floor duplex)  Plan for transportation at discharge: (P) Family    Financial    Payor: [facility] COMMUNITY HEALTH PLAN / Plan: [facility] COMMUNITY HEALTH PLAN / Product Type: *No Product type* /     Does insurance require precert for SNF: Yes    Potential assistance Purchasing Medications: (P) No  Meds-to-Beds request: Yes      [id number] - [place], [place] - [address] - P [phone] [address] [phone]  [address]  [place] [address]  Phone: [phone] Fax: [phone]      Notes:    Factors facilitating achievement of predicted outcomes: Family support, Friend support, Cooperative, and Pleasant    Barriers to discharge: Pain and Stairs at home    Additional Case Management Notes: Transitional planning: Plan is to return home with family. Daughter will  transport. Address, emergency contacts, PCP and insurance confirmed with patient. Patient denies any needs.     The Plan for Transition of Care is related to the following treatment goals of Acute pyelonephritis [N10]  Renal abscess [N15.1]    IF APPLICABLE: The Patient and/or patient representative Katherine Mcintosh and her family were provided with a choice of provider and agrees with the discharge plan. Freedom of choice list with basic dialogue that supports the patient's individualized plan of care/goals and shares the quality data associated with the providers was provided to: (P) Patient   Patient Representative Name:       The Patient and/or Patient Representative Agree with the Discharge Plan? (P) Yes    Erminio Bonine, RN  Case Management Department  Ph: [phone] Fax: [phone]

## 2024-02-08 NOTE — Progress Notes (Signed)
 [facility]   Pharmacy Pharmacokinetic Monitoring Service - Vancomycin      Katherine Mcintosh is a 58 y.o. female starting on vancomycin  therapy for bacteremia. Pharmacy consulted by Dr. Alm Cool for monitoring and adjustment.    Target Concentration: Goal AUC/MIC 400-600 mg*hr/L    Additional Antimicrobials: ceftriaxone      Pertinent Laboratory Values:   Wt Readings from Last 1 Encounters:   02/07/24 65.7 kg (144 lb 13.5 oz)     Temp Readings from Last 1 Encounters:   02/08/24 99 F (37.2 C) (Oral)     Estimated Creatinine Clearance: 112 mL/min (A) (based on SCr of 0.5 mg/dL (L)).  Recent Labs     02/07/24  2046   CREATININE 0.5*   BUN 5*   WBC 12.8*       Pertinent Cultures:  Culture Date Source Results   - - -     Plan:  Dosing recommendations based on Bayesian software  Based on age, weight, renal function and indication will initiate Vancomycin  1250 mg IV q12h  Anticipated AUC of 530 and trough concentration of 12 at steady state  Renal labs as indicated   Pharmacy will continue to monitor patient and adjust therapy as indicated    Thank you for the consult,  Fernando Stoiber Kormanyos, PharmD, 02/08/2024 8:18 AM

## 2024-02-08 NOTE — Consults (Signed)
 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  Bipolar disorder  Allergy to Penicillin: Hives    Recommendations:   Continue ceftriaxone  and vancomycin  pending culture data    Medical Decision Making/Summary/Discussion:02/08/2024       Infection Control Recommendations   Universal Precautions    Antimicrobial Stewardship Recommendations     Simplification of therapy  Targeted therapy    Coordination of Outpatient Care:   Estimated Length of IV antimicrobials:TBD  Patient will need Midline Catheter Insertion: TBD  Patient will need PICC line Insertion:TBD  Patient will need: Home IV , Infusion Center,  SNF,  LTAC: TBD  Patient will need outpatient wound care: No    Chief complaint/reason for consultation:   Renal abscess, history of IV drug use, history of TB treatment      History of Present Illness:   Katherine Mcintosh is a 58 y.o.-year-old female who was initially admitted on 02/07/2024. Patient seen at the request of Dr. Hildegard.    INITIAL HISTORY:    This patient has a past medical history of:  Drug abuse, on methadone  program  Bipolar disorder  Hepatitis C    Patient presented to [facility] due to right-sided flank pain on 02/06/24.  CT abdomen showed 2.5 cm fluid collection within the parenchyma of right kidney consistent with right-sided renal abscess.  The patient was transferred to [facility] for further management and need for drain placement by IR.    Initial labs showed urinalysis negative for WBC.    Patient denied any IV drug use, however her urine tox screen was positive for cocaine, THC, opiates.    Her urine culture from the [facility] is growing Staph aureus, no blood cultures obtained.    ID services were consulted for antibiotic recommendation.    CURRENT EVALUATION- DAILY INTERVAL CHANGES 02/08/2024  BP 100/69   Pulse 53   Temp 99 F  (37.2 C) (Oral)   Resp 16   Ht 1.651 m (5' 5)   Wt 65.7 kg (144 lb 13.5 oz)   SpO2 95%   BMI 24.10 kg/m     Afebrile  Vital signs stable  On room air    Labs reviewed:  WBC: 12.8  Hgb: 11.1  Platelets: 225    Medications reviewed:  On vancomycin  and Rocephin       Labs, X rays reviewed: 02/08/2024 with independent review of X rays    I have independently reviewed/ordered the following labs:    CBC with Differential:   Recent Labs     02/07/24  2046   WBC 12.8*   HGB 11.1*   HCT 32.9*   PLT 225     BMP:   Recent Labs     02/07/24  2046   NA 131*   K 3.9   CL 94*   CO2 26   BUN 5*   CREATININE 0.5*     Hepatic Function Panel:   Recent Labs     02/07/24  2046   BILITOT 0.4   ALKPHOS 68   ALT 48*   AST 42*     No results for input(s): RPR in the last 72 hours.  No results for input(s): HIV in the last 72 hours.  No results for input(s): BC in the last 72 hours.  Lab Results  Component Value Date/Time    BACTERIA None 02/07/2024 10:06 PM    RBC 3.67 02/07/2024 08:46 PM    WBC 12.8 02/07/2024 08:46 PM    TURBIDITY PENDING 02/08/2024 12:29 PM     Lab Results   Component Value Date/Time    CREATININE 0.5 02/07/2024 08:46 PM    GLUCOSE 94 02/07/2024 08:46 PM         Cultures:  Urine: Sent    Blood: Sent    Sputum :    Wound:    MRSA Nares:      Imaging:      Review of Systems:     Pertinent review of symptoms listed in Initial Evaluation and daily interval evaluations sections      Physical Examination :   Patient Vitals for the past 8 hrs:   BP Temp Temp src Pulse Resp SpO2   02/08/24 1215 -- -- -- -- 16 --   02/08/24 0801 -- -- -- -- 16 --   02/08/24 0754 100/69 99 F (37.2 C) Oral 53 16 95 %     General Appearance: Awake, alert, and in no apparent distress  Head:  Normocephalic, no trauma  Eyes: Pupils equal, round, reactive to light and accommodation; extraocular movements intact; sclera anicteric; conjunctivae pink. No embolic phenomena.  ENT: Oropharynx clear, without erythema, exudate, or thrush. No  tenderness of sinuses. Mouth/throat: mucosa pink and moist. No lesions. Dentition in good repair.  Neck:Supple, without lymphadenopathy. Thyroid normal, No bruits.  Pulmonary/Chest: Clear to auscultation, without wheezes, rales, or rhonchi.  No dullness to percussion.   Cardiovascular: Regular rate and rhythm without murmurs, rubs, or gallops.   Abdomen: Soft, tender on the right CVA  All four Extremities: No cyanosis, clubbing, edema, or effusions.  Neurologic: No gross sensory or motor deficits.  Skin: Warm and dry with good turgor.No signs of peripheral arterial or venous insufficiency. No ulcerations. No open wounds.      I have personally reviewed the past medical history, past surgical history, medications, social history, and family history, and I have updated the database accordingly.    Past Medical History:     Past Medical History:   Diagnosis Date    Bipolar 1 disorder (HCC)        Past Surgical  History:   No past surgical history on file.    Medications:      vancomycin  (VANCOCIN ) intermittent dosing (placeholder)   Other RX Placeholder    vancomycin   1,250 mg IntraVENous Q12H    sodium chloride  flush  5-40 mL IntraVENous 2 times per day    [Held by provider] enoxaparin   40 mg SubCUTAneous Daily    cefTRIAXone  (ROCEPHIN ) IV  1,000 mg IntraVENous Q24H       Social History:     Social History     Socioeconomic History    Marital status: Unknown     Spouse name: Not on file    Number of children: Not on file    Years of education: Not on file    Highest education level: Not on file   Occupational History    Not on file   Tobacco Use    Smoking status: Every Day     Current packs/day: 0.50     Types: Cigarettes     Passive exposure: Current    Smokeless tobacco: Not on file    Tobacco comments:     Current smoker: 0.5 pack/day x 25 yrs per pt   Substance and  Sexual Activity    Alcohol use: Not on file    Drug use: Yes     Types: Cocaine     Comment: pt states using cocaine 3 days ago    Sexual activity:  Not on file   Other Topics Concern    Not on file   Social History Narrative    Not on file     Social Drivers of Health     Financial Resource Strain: Medium Risk (09/30/2022)    Received from [facility]    Overall Financial Resource Strain (CARDIA)     Difficulty of Paying Living Expenses: Somewhat hard   Food Insecurity: No Food Insecurity (02/07/2024)    Hunger Vital Sign     Worried About Running Out of Food in the Last Year: Never true     Ran Out of Food in the Last Year: Never true   Transportation Needs: No Transportation Needs (02/07/2024)    PRAPARE - Therapist, art (Medical): No     Lack of Transportation (Non-Medical): No   Physical Activity: Sufficiently Active (11/19/2017)    Received from [facility]    Exercise Vital Sign     Days of Exercise per Week: 6 days     Minutes of Exercise per Session: 60 min   Stress: Stress Concern Present (11/19/2017)    Received from [facility]    [employer] of Occupational Health - Occupational Stress Questionnaire     Feeling of Stress : Rather much   Social Connections: Moderately Integrated (11/19/2017)    Received from [facility]    Social Connection and Isolation Panel     Frequency of Communication with Friends and Family: More than three times a week     Frequency of Social Gatherings with Friends and Family: Three times a week     Attends Religious Services: More than 4 times per year     Active Member of Clubs or Organizations: Yes     Attends Banker Meetings: More than 4 times per year     Marital Status: Divorced   Intimate Partner Violence: Not At Risk (08/07/2022)    Received from [employer] [facility] [employer], Afraid, Rape, and Kick questionnaire     Within the last year, have you been afraid of your partner or ex-partner?: No     Within the last year, have you been humiliated or emotionally abused in other ways by your partner or ex-partner?: No      Within the last year, have you been kicked, hit, slapped, or otherwise physically hurt by your partner or ex-partner?: No     Within the last year, have you been raped or forced to have any kind of sexual activity by your partner or ex-partner?: No   Housing Stability: Low Risk  (02/07/2024)    Housing Stability Vital Sign     Unable to Pay for Housing in the Last Year: No     Number of Times Moved in the Last Year: 0     Homeless in the Last Year: No       Family History:   No family history on file.     Allergies:   Penicillins       Medical Decision Making-Imaging:   No results found.    Medical Decision Making-Cultures-Other:     Results       Procedure Component Value Units Date/Time  C.trachomatis N.gonorrhoeae DNA, Urine [[id number]] Collected: 02/08/24 1229    Order Status: Sent Specimen: Urine Updated: 02/08/24 1229    Culture, Blood 1 [[id number]]     Order Status: Sent Specimen: Blood     Culture, Blood 2 [[id number]]     Order Status: Sent Specimen: Blood               Medical Decision Making-Other:     Note:      Thank you for allowing us  to participate in the care of this patient. Please call with questions.    Birda Opoka, MD    ATTESTATION:    I have discussed the case, including pertinent history and exam findings with the medical resident or medical student. I have evaluated the  History, physical findings and pictures of the patient and the key elements of the encounter have been performed by me. I have reviewed the laboratory data, other diagnostic studies and discussed them with the medical resident or student. I have updated the medical record where necessary.    I agree with the assessment, plan and orders as documented by the medical resident or student and I have modified them as necessary.     Sheree Brim, MD    02/08/2024

## 2024-02-08 NOTE — Plan of Care (Signed)
 Problem: Chronic Conditions and Co-morbidities  Goal: Patient's chronic conditions and co-morbidity symptoms are monitored and maintained or improved  Outcome: Progressing     Problem: Pain  Goal: Verbalizes/displays adequate comfort level or baseline comfort level  Outcome: Progressing     Problem: Genitourinary - Adult  Goal: Absence of urinary retention  Outcome: Progressing     Problem: Infection - Adult  Goal: Absence of infection at discharge  Outcome: Progressing

## 2024-02-09 ENCOUNTER — Inpatient Hospital Stay: Admit: 2024-02-09 | Payer: Medicaid (Managed Care) | Primary: Specialist

## 2024-02-09 LAB — BASIC METABOLIC PANEL
Anion Gap: 8 mmol/L — ABNORMAL LOW (ref 9–16)
BUN: 5 mg/dL — ABNORMAL LOW (ref 6–20)
CO2: 25 mmol/L (ref 20–31)
Calcium: 8.6 mg/dL (ref 8.6–10.4)
Chloride: 102 mmol/L (ref 98–107)
Creatinine: 0.4 mg/dL — ABNORMAL LOW (ref 0.6–0.9)
Est, Glom Filt Rate: >90 mL/min/1.73m2 (ref >60)
Glucose: 105 mg/dL — ABNORMAL HIGH (ref 74–99)
Potassium: 4.2 mmol/L (ref 3.7–5.3)
Sodium: 135 mmol/L — ABNORMAL LOW (ref 136–145)

## 2024-02-09 LAB — C.TRACHOMATIS N.GONORRHOEAE DNA, URINE
C. trachomatis DNA ,Urine: NEGATIVE
N. gonorrhoeae DNA, Urine: NEGATIVE

## 2024-02-09 LAB — VANCOMYCIN LEVEL, RANDOM: Vancomycin Rm: 11.1 ug/mL (ref 5.0–40.0)

## 2024-02-09 LAB — CBC
Hematocrit: 29.7 % — ABNORMAL LOW (ref 36.3–47.1)
Hemoglobin: 9.9 g/dL — ABNORMAL LOW (ref 11.9–15.1)
MCH: 30.3 pg (ref 25.2–33.5)
MCHC: 33.3 g/dL (ref 28.4–34.8)
MCV: 90.8 fL (ref 82.6–102.9)
MPV: 11.2 fL (ref 8.1–13.5)
NRBC Automated: 0 /100{WBCs}
Platelets: 246 k/uL (ref 138–453)
RBC: 3.27 m/uL — ABNORMAL LOW (ref 3.95–5.11)
RDW: 12.8 % (ref 11.8–14.4)
WBC: 7.8 k/uL (ref 3.5–11.3)

## 2024-02-09 LAB — MRSA DNA PROBE, NASAL: MRSA, DNA, Nasal: POSITIVE — AB

## 2024-02-09 LAB — CULTURE, URINE: Culture: NO GROWTH

## 2024-02-09 LAB — T. PALLIDUM AB: T. pallidum, IgG: NONREACTIVE

## 2024-02-09 MED ORDER — IOPAMIDOL 76 % IV SOLN
76 | Freq: Once | INTRAVENOUS | Status: AC | PRN
Start: 2024-02-09 — End: 2024-02-09
  Administered 2024-02-09: 13:00:00 75 mL via INTRAVENOUS

## 2024-02-09 MED ORDER — METHADONE HCL 10 MG/ML PO CONC
10 | Freq: Every day | ORAL | Status: DC
Start: 2024-02-09 — End: 2024-02-13
  Administered 2024-02-09 – 2024-02-13 (×5): 105 mg via ORAL

## 2024-02-09 MED ORDER — MELATONIN 3 MG PO TABS
3 | Freq: Every evening | ORAL | Status: DC | PRN
Start: 2024-02-09 — End: 2024-02-13
  Administered 2024-02-09 – 2024-02-13 (×2): 3 mg via ORAL

## 2024-02-09 MED ORDER — DOCUSATE SODIUM 100 MG PO CAPS
100 | Freq: Two times a day (BID) | ORAL | Status: DC | PRN
Start: 2024-02-09 — End: 2024-02-13
  Administered 2024-02-09: 04:00:00 100 mg via ORAL

## 2024-02-09 MED ORDER — PANTOPRAZOLE SODIUM 20 MG PO TBEC
20 | Freq: Every day | ORAL | Status: DC
Start: 2024-02-09 — End: 2024-02-13
  Administered 2024-02-09 – 2024-02-13 (×4): 20 mg via ORAL

## 2024-02-09 MED FILL — VANCOMYCIN HCL 1.25 G IV SOLR: 1.25 g | INTRAVENOUS | Qty: 1250 | Fill #0

## 2024-02-09 MED FILL — MELATONIN 3 MG PO TABS: 3 mg | ORAL | Qty: 1 | Fill #0

## 2024-02-09 MED FILL — PANTOPRAZOLE SODIUM 20 MG PO TBEC: 20 mg | ORAL | Qty: 1 | Fill #0

## 2024-02-09 MED FILL — DOCUSATE SODIUM 100 MG PO CAPS: 100 mg | ORAL | Qty: 1 | Fill #0

## 2024-02-09 MED FILL — OXYCODONE-ACETAMINOPHEN 5-325 MG PO TABS: 5-325 mg | ORAL | Qty: 1 | Fill #0

## 2024-02-09 MED FILL — ONDANSETRON HCL 4 MG/2ML IJ SOLN: 4 MG/2ML | INTRAMUSCULAR | Qty: 2 | Fill #0

## 2024-02-09 MED FILL — CEFTRIAXONE SODIUM 1 G IJ SOLR: 1 g | INTRAMUSCULAR | Qty: 1000 | Fill #0

## 2024-02-09 MED FILL — ONDANSETRON 4 MG PO TBDP: 4 mg | ORAL | Qty: 1 | Fill #0

## 2024-02-09 MED FILL — METHADONE HCL 10 MG/ML PO CONC: 10 mg/mL | ORAL | Qty: 15 | Fill #0

## 2024-02-09 NOTE — Care Coordination-Inpatient (Signed)
 Met with pt after receiving a social work consult for drug use/bipolar.  Pt is alert and oriented.  She expressed her dismay that social work was consulted.  She stated that she did not need social work for anything.  She then stated goodbye and would not let SW assist with anything.

## 2024-02-09 NOTE — Progress Notes (Signed)
 Baig, Zafar, Santacroce, Khan, Mostafa, Buck, Murtagh Jr  Urology Progress Note     Chief Complaint: Right renal abscess    Subjective:  No acute events overnight, afebrile, vital signs stable  Pain level: Moderate  Denies fever, chills, nausea, vomiting, chest pain, shortness of breath    AM labs pending    Patient Vitals for the past 24 hrs:   BP Temp Temp src Pulse Resp SpO2 Weight   02/09/24 0639 -- -- -- -- 16 -- --   02/09/24 0612 -- -- -- -- -- -- 70.5 kg (155 lb 6.8 oz)   02/09/24 0609 -- -- -- -- 17 -- --   02/09/24 0233 -- -- -- -- 17 -- --   02/09/24 0203 -- -- -- -- 17 -- --   02/08/24 2146 -- -- -- -- 17 -- --   02/08/24 2116 -- -- -- -- 18 -- --   02/08/24 1955 109/61 97.9 F (36.6 C) Oral 60 17 97 % --   02/08/24 1648 -- -- -- -- 16 -- --   02/08/24 1215 -- -- -- -- 16 -- --   02/08/24 0801 -- -- -- -- 16 -- --   02/08/24 0754 100/69 99 F (37.2 C) Oral 53 16 95 % --       Intake/Output Summary (Last 24 hours) at 02/09/2024 0648  Last data filed at 02/09/2024 9386  Gross per 24 hour   Intake 2657.68 ml   Output 1600 ml   Net 1057.68 ml       Recent Labs     02/07/24  2046   WBC 12.8*   HGB 11.1*   HCT 32.9*   MCV 89.6   PLT 225     Recent Labs     02/07/24  2046   NA 131*   K 3.9   CL 94*   CO2 26   BUN 5*   CREATININE 0.5*       Recent Labs     02/08/24  1229   COLORU Yellow   PHUR 7.0   WBCUA 5 TO 10   RBCUA 2 TO 5   BACTERIA None   LEUKOCYTESUR TRACE*   UROBILINOGEN Normal   BILIRUBINUR NEGATIVE       Additional Lab/culture results:  Ucx     Physical Exam:  Constitutional: Patient in no acute distress.  Neuro: alert and oriented to person place and time.  HEENT: No acute abnormalities  Lungs: Respiratory effort normal on room air  Cardiovascular:  Heart rate regular rate and rhythm  Abdomen: Soft, non-tender, non-distended. Non peritonitic  Extremities: No leg swelling, no edema  GU: Right CVA tenderness    Interval Imaging Findings:  None    Impression:  58 year old female with 2.5 cm right  renal abscess. She does have significant risk factors for hematogenous spread. She denies IV drug use but does have a history of hepatitis C, urine tox was positive for opioids, THC, cocaine. Urine culture from [facility] growing Staph aureus. She is currently on vancomycin  and Rocephin , blood cultures pending. Will manage conservatively at this time given the fact that the abscess is under 3 cm and she is not immunocompromised.  Plan to reimage today.    Plan:  -Keep n.p.o., possible IR drainage of abscess if it worsens  - Repeat CT abdomen pelvis with IV contrast  - Continue vancomycin  and Rocephin     Alm Cool, MD  Urology Resident, PGY-5

## 2024-02-09 NOTE — Progress Notes (Signed)
 Katherine Mcintosh   Pharmacy Pharmacokinetic Monitoring Service - Vancomycin     Consulting Provider: Dr. York   Indication: Bacteremia  Target Concentration: Goal AUC/MIC 400-600 mg*hr/L  Day of Therapy: 2  Additional Antimicrobials: Ceftriaxone     Pertinent Laboratory Values:   Wt Readings from Last 1 Encounters:   02/09/24 70.5 kg (155 lb 6.8 oz)     Temp Readings from Last 1 Encounters:   02/09/24 98.2 F (36.8 C) (Oral)     Estimated Creatinine Clearance: 153 mL/min (A) (based on SCr of 0.4 mg/dL (L)).  Recent Labs     02/07/24  2046 02/09/24  0641   CREATININE 0.5* 0.4*   BUN 5* 5*   WBC 12.8* 7.8       Pertinent Cultures:  Culture Date Source Results   9/14 Nasal  MRSA detected   MRSA Nasal Swab: showed MRSA positive result on 9/14    Recent vancomycin  administrations                     vancomycin  (VANCOCIN ) 1,250 mg in sodium chloride  0.9 % 250 mL IVPB (Vial2Bag) (mg) 1,250 mg New Bag 02/09/24 0947     1,250 mg New Bag 02/08/24 2121     1,250 mg New Bag  0927                    Assessment:  Date/Time Current Dose Concentration Timing of Concentration (h) AUC   9/15 @ 0641 1250 mg every 12 hours 11.1 9 h 19 min 497   Note: Serum concentrations collected for AUC dosing may appear elevated if collected in close proximity to the dose administered, this is not necessarily an indication of toxicity    Plan:  Current dosing regimen is therapeutic  Continue current dose  Repeat vancomycin  concentration not ordered   Pharmacy will continue to monitor patient and adjust therapy as indicated    Thank you for the consult,  Katherine Mcintosh, [facility] Mcintosh  02/09/2024 10:35 AM

## 2024-02-09 NOTE — Plan of Care (Signed)
 Problem: Chronic Conditions and Co-morbidities  Goal: Patient's chronic conditions and co-morbidity symptoms are monitored and maintained or improved  02/09/2024 1626 by Dolores Hough, RN  Outcome: Progressing  02/09/2024 0627 by Jaquita Mayo, RN  Outcome: Progressing     Problem: Pain  Goal: Verbalizes/displays adequate comfort level or baseline comfort level  02/09/2024 1626 by Dolores Hough, RN  Outcome: Progressing  02/09/2024 0627 by Jaquita Mayo, RN  Outcome: Progressing     Problem: Genitourinary - Adult  Goal: Absence of urinary retention  02/09/2024 1626 by Dolores Hough, RN  Outcome: Progressing  02/09/2024 0627 by Jaquita Mayo, RN  Outcome: Progressing     Problem: Infection - Adult  Goal: Absence of infection at discharge  02/09/2024 1626 by Dolores Hough, RN  Outcome: Progressing  02/09/2024 0627 by Jaquita Mayo, RN  Outcome: Progressing     Problem: Safety - Adult  Goal: Free from fall injury  02/09/2024 1626 by Dolores Hough, RN  Outcome: Progressing  02/09/2024 0627 by Jaquita Mayo, RN  Outcome: Progressing

## 2024-02-09 NOTE — Progress Notes (Signed)
 Called [facility], notified them about the drug screen results. Doctor and April, RN verified that it was fine to keep treating pt with methadone . Verified methadone  dosage, 105mg  daily.   [phone]  Patient ID [id number]

## 2024-02-09 NOTE — Plan of Care (Signed)
 Problem: Chronic Conditions and Co-morbidities  Goal: Patient's chronic conditions and co-morbidity symptoms are monitored and maintained or improved  02/09/2024 0627 by Jaquita Mayo, RN  Outcome: Progressing  02/08/2024 1819 by Charmain Bouchard, RN  Outcome: Progressing     Problem: Pain  Goal: Verbalizes/displays adequate comfort level or baseline comfort level  02/09/2024 0627 by Jaquita Mayo, RN  Outcome: Progressing  02/08/2024 1819 by Charmain Bouchard, RN  Outcome: Progressing     Problem: Genitourinary - Adult  Goal: Absence of urinary retention  02/09/2024 0627 by Jaquita Mayo, RN  Outcome: Progressing  02/08/2024 1819 by Charmain Bouchard, RN  Outcome: Progressing     Problem: Infection - Adult  Goal: Absence of infection at discharge  02/09/2024 0627 by Jaquita Mayo, RN  Outcome: Progressing  02/08/2024 1819 by Charmain Bouchard, RN  Outcome: Progressing     Problem: Safety - Adult  Goal: Free from fall injury  Outcome: Progressing

## 2024-02-09 NOTE — Progress Notes (Signed)
 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  Bipolar disorder  Allergy to Penicillin: Hives    Recommendations:   Continue ceftriaxone  and vancomycin  pending culture data    Medical Decision Making/Summary/Discussion:02/09/2024       Infection Control Recommendations   Universal Precautions    Antimicrobial Stewardship Recommendations     Simplification of therapy  Targeted therapy    Coordination of Outpatient Care:   Estimated Length of IV antimicrobials:TBD  Patient will need Midline Catheter Insertion: TBD  Patient will need PICC line Insertion:TBD  Patient will need: Home IV , Infusion Center,  SNF,  LTAC: TBD  Patient will need outpatient wound care: No    Chief complaint/reason for consultation:   Renal abscess, history of IV drug use, history of TB treatment      History of Present Illness:   Katherine Mcintosh is a 58 y.o.-year-old female who was initially admitted on 02/07/2024. Patient seen at the request of Dr. Hildegard.    INITIAL HISTORY:    This patient has a past medical history of:  Drug abuse, on methadone  program  Bipolar disorder  Hepatitis C    Patient presented to [facility] due to right-sided flank pain on 02/06/24.  CT abdomen showed 2.5 cm fluid collection within the parenchyma of right kidney consistent with right-sided renal abscess.  The patient was transferred to [facility] for further management and need for drain placement by IR.    Initial labs showed urinalysis negative for WBC.    Patient denied any IV drug use, however her urine tox screen was positive for cocaine, THC, opiates.    Her urine culture from the [facility] is growing Staph aureus, no blood cultures obtained.    ID services were consulted for antibiotic recommendation.    CURRENT EVALUATION- DAILY INTERVAL CHANGES 02/09/2024  BP 111/76   Pulse 54   Temp 98.2 F  (36.8 C) (Oral)   Resp 16   Ht 1.651 m (5' 5)   Wt 70.5 kg (155 lb 6.8 oz)   SpO2 97%   BMI 25.86 kg/m     Afebrile  Vital signs stable  On room air    No issue or complaints reported overnight    Possible IR drainage of abscess if patient destabilizes.     Labs reviewed:  WBC: 12.8 --> 7.8  Hgb: 11.1  --> 9.9  Platelets: 225 --> 246    Medications reviewed:  On vancomycin  and Rocephin       Labs, X rays reviewed: 02/09/2024 with independent review of X rays    I have independently reviewed/ordered the following labs:    CBC with Differential:   Recent Labs     02/07/24  2046 02/09/24  0641   WBC 12.8* 7.8   HGB 11.1* 9.9*   HCT 32.9* 29.7*   PLT 225 246     BMP:   Recent Labs     02/07/24  2046 02/09/24  0641   NA 131* 135*   K 3.9 4.2   CL 94* 102   CO2 26 25   BUN 5* 5*   CREATININE 0.5* 0.4*     Hepatic Function Panel:   Recent Labs     02/07/24  2046   BILITOT 0.4   ALKPHOS 68   ALT 48*  AST 42*     No results for input(s): RPR in the last 72 hours.  No results for input(s): HIV in the last 72 hours.  No results for input(s): BC in the last 72 hours.  Lab Results   Component Value Date/Time    BACTERIA None 02/08/2024 12:29 PM    RBC 3.27 02/09/2024 06:41 AM    WBC 7.8 02/09/2024 06:41 AM    TURBIDITY Clear 02/08/2024 12:29 PM     Lab Results   Component Value Date/Time    CREATININE 0.4 02/09/2024 06:41 AM    GLUCOSE 105 02/09/2024 06:41 AM         Cultures:  Urine:   Urine from [facility]: S aureus  02-08-24: No growth   Blood:   02-08-24: No growth   Sputum :    Wound:    MRSA Nares:  02-08-24: Positive    Imaging:    02-09-2024          Review of Systems:     Pertinent review of symptoms listed in Initial Evaluation and daily interval evaluations sections      Physical Examination :   Patient Vitals for the past 8 hrs:   BP Temp Temp src Pulse Resp SpO2 Weight   02/09/24 0730 111/76 98.2 F (36.8 C) Oral 54 16 97 % --   02/09/24 0639 -- -- -- -- 16 -- --   02/09/24 0612 -- -- -- -- -- -- 70.5 kg  (155 lb 6.8 oz)   02/09/24 0609 -- -- -- -- 17 -- --   02/09/24 0233 -- -- -- -- 17 -- --   02/09/24 0203 -- -- -- -- 17 -- --     General Appearance: Awake, alert, and in no apparent distress  Head:  Normocephalic, no trauma  Eyes: Pupils equal, round, reactive to light and accommodation; extraocular movements intact; sclera anicteric; conjunctivae pink. No embolic phenomena.  ENT: Oropharynx clear, without erythema, exudate, or thrush. No tenderness of sinuses. Mouth/throat: mucosa pink and moist. No lesions. Dentition in good repair.  Neck:Supple, without lymphadenopathy. Thyroid normal, No bruits.  Pulmonary/Chest: Clear to auscultation, without wheezes, rales, or rhonchi.  No dullness to percussion.   Cardiovascular: Regular rate and rhythm without murmurs, rubs, or gallops.   Abdomen: Soft, tender on the right CVA  All four Extremities: No cyanosis, clubbing, edema, or effusions.  Neurologic: No gross sensory or motor deficits.  Skin: Warm and dry with good turgor.No signs of peripheral arterial or venous insufficiency. No ulcerations. No open wounds.      I have personally reviewed the past medical history, past surgical history, medications, social history, and family history, and I have updated the database accordingly.    Past Medical History:     Past Medical History:   Diagnosis Date    Bipolar 1 disorder (HCC)        Past Surgical  History:   No past surgical history on file.    Medications:      pantoprazole   20 mg Oral QAM AC    vancomycin  (VANCOCIN ) intermittent dosing (placeholder)   Other RX Placeholder    vancomycin   1,250 mg IntraVENous Q12H    sodium chloride  flush  5-40 mL IntraVENous 2 times per day    [Held by provider] enoxaparin   40 mg SubCUTAneous Daily    cefTRIAXone  (ROCEPHIN ) IV  1,000 mg IntraVENous Q24H       Social History:     Social History  Socioeconomic History    Marital status: Unknown     Spouse name: Not on file    Number of children: Not on file    Years of education: Not  on file    Highest education level: Not on file   Occupational History    Not on file   Tobacco Use    Smoking status: Every Day     Current packs/day: 0.50     Types: Cigarettes     Passive exposure: Current    Smokeless tobacco: Not on file    Tobacco comments:     Current smoker: 0.5 pack/day x 25 yrs per pt   Substance and Sexual Activity    Alcohol use: Not on file    Drug use: Yes     Types: Cocaine     Comment: pt states using cocaine 3 days ago    Sexual activity: Not on file   Other Topics Concern    Not on file   Social History Narrative    Not on file     Social Drivers of Health     Financial Resource Strain: Medium Risk (09/30/2022)    Received from [facility]    Overall Financial Resource Strain (CARDIA)     Difficulty of Paying Living Expenses: Somewhat hard   Food Insecurity: No Food Insecurity (02/07/2024)    Hunger Vital Sign     Worried About Running Out of Food in the Last Year: Never true     Ran Out of Food in the Last Year: Never true   Transportation Needs: No Transportation Needs (02/07/2024)    PRAPARE - Therapist, art (Medical): No     Lack of Transportation (Non-Medical): No   Physical Activity: Sufficiently Active (11/19/2017)    Received [facility]    Exercise Vital Sign     Days of Exercise per Week: 6 days     Minutes of Exercise per Session: 60 min   Stress: Stress Concern Present (11/19/2017)    Received from [facility]    [employer] of Occupational Health - Occupational Stress Questionnaire     Feeling of Stress : Rather much   Social Connections: Moderately Integrated (11/19/2017)    Received from [facility]    Social Connection and Isolation Panel     Frequency of Communication with Friends and Family: More than three times a week     Frequency of Social Gatherings with Friends and Family: Three times a week     Attends Religious Services: More than 4 times per year     Active Member of Clubs or  Organizations: Yes     Attends Banker Meetings: More than 4 times per year     Marital Status: Divorced   Intimate Partner Violence: Not At Risk (08/07/2022)    Received from [employer] [facility] [employer], Afraid, Rape, and Kick questionnaire     Within the last year, have you been afraid of your partner or ex-partner?: No     Within the last year, have you been humiliated or emotionally abused in other ways by your partner or ex-partner?: No     Within the last year, have you been kicked, hit, slapped, or otherwise physically hurt by your partner or ex-partner?: No     Within the last year, have you been raped or forced to have any kind of sexual activity by your  partner or ex-partner?: No   Housing Stability: Low Risk  (02/07/2024)    Housing Stability Vital Sign     Unable to Pay for Housing in the Last Year: No     Number of Times Moved in the Last Year: 0     Homeless in the Last Year: No       Family History:   No family history on file.     Allergies:   Penicillins       Medical Decision Making-Imaging:   No results found.    Medical Decision Making-Cultures-Other:     Results       Procedure Component Value Units Date/Time    MRSA DNA Probe, Nasal [[id number]] Collected: 02/08/24 1834    Order Status: Sent Specimen: Nasal swab from [facility], Bilateral Updated: 02/08/24 1835    Culture, Blood 2 [[id number]] Collected: 02/08/24 1812    Order Status: Completed Specimen: Blood Updated: 02/09/24 0641     Specimen Description .BLOOD     Special Requests LFT ARM     Culture NO GROWTH 12 HOURS    Culture, Blood 1 [[id number]] Collected: 02/08/24 1807    Order Status: Completed Specimen: Blood Updated: 02/09/24 0641     Specimen Description .BLOOD     Special Requests RT ARM     Culture NO GROWTH 12 HOURS    Culture, Urine [[id number]] Collected: 02/08/24 1709    Order Status: Sent Specimen: Urine, Clean Catch Updated: 02/08/24 1709    Culture, Blood 1 [[id number]] Collected: 02/08/24  1500    Order Status: Canceled Specimen: Blood     C.trachomatis N.gonorrhoeae DNA, Urine [[id number]] Collected: 02/08/24 1229    Order Status: Sent Specimen: Urine Updated: 02/08/24 1229              Medical Decision Making-Other:     Note:      Thank you for allowing us  to participate in the care of this patient. Please call with questions.    Tita Caprice Fox, DPM    ATTESTATION:    I have discussed the case, including pertinent history and exam findings with the medical resident or medical student. I have evaluated the  History, physical findings and pictures of the patient and the key elements of the encounter have been performed by me. I have reviewed the laboratory data, other diagnostic studies and discussed them with the medical resident or student. I have updated the medical record where necessary.    I agree with the assessment, plan and orders as documented by the medical resident or student and I have modified them as necessary.     Sheree Brim, MD    02/09/2024       02/09/2024

## 2024-02-09 NOTE — Progress Notes (Signed)
 [facility]  Office: [phone]  Marty Fitz, DO, Carlin Gula, DO, Taft Dust, DO, Reyes Blood, DO, Annalee Oppenheim, MD, Renard Sago, MD, Vergia Meter, MD, Rico Gill, MD,  Penne Shams, MD, Loyal Bathe, MD, Zainulabedin Waqar, MD,  Emery Clam, DO, Ozell Fitz, DO, Lucienne Cordial, MD,  Mabel Kemps, DO, Shelda Medico, MD, Inocente Burner, MD, Dilnoor Willy, MD,  Kalyan Jagarlamudi, MD, Korene Muller, MD, Caridad Coca, MD, Rosi Odea, MD, Margretta Kays, MD, Consepcion Heady, MD, Toribio Hole, DO, Robbi Buss, MD, Vincenza Fairly, DO, Eva Barefoot, MD, Ronal Lennox, MD, Emery Blossom, MD, Mohsin Blossom, MD, Damian Bjork, MD, Orlean Bart, CNP,  Santana Gavel, CNP, Toribio Cooks, CNP,  Mliss Nicely, DNP, Charlene Pang, CNP, Lolita Herald, CNP, Lauraine King, CNP, Addie Lass, CNP, Tinnie Acre, PA-C, Aimee Miester, CNP, Shirlene Pouch, CNP,  Mathilda Meager, CNP, Devere Ingle, CNP, Oliva Magnuson, PA-C, [place], PA-C, [place], CNP, Jeoffrey Ada, CNP,  Emmie Dutton, CNS, Edsel Server, CNP, Rock Sink, CNP         [facility]   [facility]    Progress Note    02/09/2024    5:20 PM    Name:   Katherine Mcintosh  MRN:     [id number]     Acct:      [id number]   Room:   [id number]  IP Day:  2  Admit Date:  02/07/2024  8:18 PM    PCP:   Jerilynn Slough, APRN - CNP  Code Status:  Full Code    Subjective:     There was no events overnight.  The patient declined examination or history today as she was upset for asking psychiatry consult given her history of bipolar stating that I used the history against her.      Brief History:   Katherine Mcintosh is a 58 y.o. Unavailable / unknown female who presents with No chief complaint on file.   and is admitted to the hospital for the management of Acute pyelonephritis.  Has a history of bipolar, prior of history of drug abuse, she is on methadone  program.  She has a history of  bipolar, drug use, on methadone , h/o treated latent TB  Presented to [facility] hospital due to right flank pain, she was found to have right sided renal abscess, she was transferred to our hospital for urology procedure.  ROS, severe nausea, vomiting, diarrhea, right abd pain, difficulty passing urine, fever, chills for 4 days.  Admit of smoking cracks as she lost her ride to methadone  clinic for 11 days, complains of having rash on her lips.      Review of Systems:     Constitutional:  negative for chills, fevers, sweats  Respiratory:  negative for cough, dyspnea on exertion, shortness of breath, wheezing  Cardiovascular:  negative for chest pain, chest pressure/discomfort, lower extremity edema, palpitations  Gastrointestinal:  negative for abdominal pain, constipation, diarrhea, nausea, vomiting  Neurological:  negative for dizziness, headache  Medications:     Allergies:    Allergies   Allergen Reactions    Penicillins Hives       Current Meds:   Scheduled Meds:    pantoprazole   20 mg Oral QAM AC    methadone   105 mg Oral Daily    vancomycin  (VANCOCIN ) intermittent dosing (placeholder)   Other RX Placeholder    vancomycin   1,250 mg IntraVENous Q12H    sodium chloride  flush  5-40 mL IntraVENous  2 times per day    [Held by provider] enoxaparin   40 mg SubCUTAneous Daily    cefTRIAXone  (ROCEPHIN ) IV  1,000 mg IntraVENous Q24H     Continuous Infusions:    sodium chloride  125 mL/hr at 02/09/24 9386    sodium chloride        PRN Meds: docusate sodium , melatonin, sodium chloride  flush, sodium chloride , ondansetron  **OR** ondansetron , acetaminophen  **OR** acetaminophen , polyethylene glycol, oxyCODONE -acetaminophen     Data:     Vitals:  BP 122/77   Pulse 63   Temp 97.9 F (36.6 C) (Oral)   Resp 18   Ht 1.651 m (5' 5)   Wt 70.5 kg (155 lb 6.8 oz)   SpO2 97%   BMI 25.86 kg/m   Temp (24hrs), Avg:98 F (36.7 C), Min:97.9 F (36.6 C), Max:98.2 F (36.8 C)    No results for input(s): POCGLU in the last 72  hours.    I/O (24Hr):    Intake/Output Summary (Last 24 hours) at 02/09/2024 1720  Last data filed at 02/09/2024 9386  Gross per 24 hour   Intake 2657.68 ml   Output 500 ml   Net 2157.68 ml       Labs:  Hematology:  Recent Labs     02/07/24  2046 02/08/24  0737 02/09/24  0641   WBC 12.8*  --  7.8   RBC 3.67*  --  3.27*   HGB 11.1*  --  9.9*   HCT 32.9*  --  29.7*   MCV 89.6  --  90.8   MCH 30.2  --  30.3   MCHC 33.7  --  33.3   RDW 12.7  --  12.8   PLT 225  --  246   MPV 10.8  --  11.2   INR  --  1.1  --      Chemistry:  Recent Labs     02/07/24  2046 02/09/24  0641   NA 131* 135*   K 3.9 4.2   CL 94* 102   CO2 26 25   GLUCOSE 94 105*   BUN 5* 5*   CREATININE 0.5* 0.4*   ANIONGAP 11 8*   LABGLOM >90 >90   CALCIUM 8.6 8.6     Recent Labs     02/07/24  2046   TSH 0.87   AST 42*   ALT 48*   ALKPHOS 68   BILITOT 0.4     ABG:No results found for: POCPH, PHART, PH, POCPCO2, PCO2ART, PCO2, POCPO2, PO2ART, PO2, POCHCO3, HCO3ART, HCO3, NBEA, PBEA, BEART, BE, THGBART, THB, TCO2ART, POCO2SAT, O2SATART, O2SAT, FIO2  Lab Results   Component Value Date/Time    SPECIAL LFT ARM 02/08/2024 06:12 PM     Lab Results   Component Value Date/Time    CULTURE NO GROWTH 12 HOURS 02/08/2024 06:12 PM       Radiology:  CT ABDOMEN PELVIS W IV CONTRAST Additional Contrast? None  Result Date: 02/09/2024  1. Right renal cortical lesion with associated enhancing thickened rind measuring approximately 2.4 x 2.1 cm and adjacent perinephric hemorrhage or inflammatory change, concerning for renal abscess versus neoplasm. No right-sided hydronephrosis or renal calculus. Further evaluation with MRI may be helpful. 2. No left renal calculus, hydronephrosis, or renal lesion. 3. Moderate to large volume fecal residuals in the colon.       Physical Examination:     General appearance:  alert, cooperative and no distress  Mental Status:  oriented to person, place and time and  normal affect  Lungs:  clear to  auscultation bilaterally, normal effort  Heart:  regular rate and rhythm, no murmur  Abdomen:  soft, nontender, nondistended, normal bowel sounds, no masses, hepatomegaly, splenomegaly  Extremities:  no edema, redness, tenderness in the calves  Skin:  no gross lesions, rashes, induration    Assessment:     Hospital Problems           Last Modified POA    * (Principal) Acute pyelonephritis 02/07/2024 Yes    Acute stress reaction 02/08/2024 Yes    Renal abscess 02/08/2024 Yes       Plan:   Renal abscess  Evaluated by urology who are planning for conservative treatment now  IV ABX  F/U Blood and urine culture.  ID consult  F/u IR tmw regarding intervention.     Bipolar disorder,   Not on meds  States she self medicate  Will get psych consult  Social worker consult     H/o drug abuse,   On methadone  program, states her dose is 120 mg.     Mouth rash,   States it has started recently post crack smoking.  Could be vasculitis vs herpes  The patient states that she is using Abreva cream.        H/o hepatitis C  Not treated due to low level.          Medical Decision Making: Medium      Wynell Lash, MD  02/09/2024  5:20 PM

## 2024-02-10 ENCOUNTER — Inpatient Hospital Stay: Admit: 2024-02-10 | Payer: Medicaid (Managed Care) | Primary: Specialist

## 2024-02-10 LAB — CBC WITH AUTO DIFFERENTIAL
Basophils %: 1 % (ref 0–2)
Basophils Absolute: 0.07 k/uL (ref 0.00–0.20)
Eosinophils %: 2 % (ref 1–4)
Eosinophils Absolute: 0.18 k/uL (ref 0.00–0.44)
Hematocrit: 30.5 % — ABNORMAL LOW (ref 36.3–47.1)
Hemoglobin: 10.3 g/dL — ABNORMAL LOW (ref 11.9–15.1)
Immature Granulocytes %: 1 % — ABNORMAL HIGH
Immature Granulocytes Absolute: 0.05 k/uL (ref 0.00–0.30)
Lymphocytes %: 20 % — ABNORMAL LOW (ref 24–43)
Lymphocytes Absolute: 2.16 k/uL (ref 1.10–3.70)
MCH: 30 pg (ref 25.2–33.5)
MCHC: 33.8 g/dL (ref 28.4–34.8)
MCV: 88.9 fL (ref 82.6–102.9)
MPV: 11 fL (ref 8.1–13.5)
Monocytes %: 8 % (ref 3–12)
Monocytes Absolute: 0.9 k/uL (ref 0.10–1.20)
NRBC Automated: 0 /100{WBCs}
Neutrophils %: 68 % — ABNORMAL HIGH (ref 36–65)
Neutrophils Absolute: 7.37 k/uL (ref 1.50–8.10)
Platelets: 348 k/uL (ref 138–453)
RBC: 3.43 m/uL — ABNORMAL LOW (ref 3.95–5.11)
RDW: 12.7 % (ref 11.8–14.4)
WBC: 10.7 k/uL (ref 3.5–11.3)

## 2024-02-10 LAB — CBC
Hematocrit: 35 % — ABNORMAL LOW (ref 36.3–47.1)
Hemoglobin: 11.4 g/dL — ABNORMAL LOW (ref 11.9–15.1)
MCH: 29.6 pg (ref 25.2–33.5)
MCHC: 32.6 g/dL (ref 28.4–34.8)
MCV: 90.9 fL (ref 82.6–102.9)
MPV: 10.9 fL (ref 8.1–13.5)
NRBC Automated: 0 /100{WBCs}
Platelets: 345 k/uL (ref 138–453)
RBC: 3.85 m/uL — ABNORMAL LOW (ref 3.95–5.11)
RDW: 12.8 % (ref 11.8–14.4)
WBC: 11.4 k/uL — ABNORMAL HIGH (ref 3.5–11.3)

## 2024-02-10 LAB — VANCOMYCIN LEVEL, RANDOM: Vancomycin Rm: 12.5 ug/mL (ref 5.0–40.0)

## 2024-02-10 LAB — BASIC METABOLIC PANEL
Anion Gap: 9 mmol/L (ref 9–16)
BUN: 3 mg/dL — ABNORMAL LOW (ref 6–20)
CO2: 27 mmol/L (ref 20–31)
Calcium: 9.1 mg/dL (ref 8.6–10.4)
Chloride: 101 mmol/L (ref 98–107)
Creatinine: 0.6 mg/dL (ref 0.6–0.9)
Est, Glom Filt Rate: >90 mL/min/1.73m2 (ref >60)
Glucose: 89 mg/dL (ref 74–99)
Potassium: 5 mmol/L (ref 3.7–5.3)
Sodium: 137 mmol/L (ref 136–145)

## 2024-02-10 LAB — MAGNESIUM: Magnesium: 1.9 mg/dL (ref 1.6–2.6)

## 2024-02-10 MED ORDER — IOPAMIDOL 76 % IV SOLN
76 | Freq: Once | INTRAVENOUS | Status: AC | PRN
Start: 2024-02-10 — End: 2024-02-10
  Administered 2024-02-11: 01:00:00 75 mL via INTRAVENOUS

## 2024-02-10 MED ORDER — MORPHINE SULFATE (PF) 2 MG/ML IV SOLN
2 | INTRAVENOUS | Status: DC | PRN
Start: 2024-02-10 — End: 2024-02-12
  Administered 2024-02-10 – 2024-02-12 (×5): 2 mg via INTRAVENOUS

## 2024-02-10 MED ORDER — POLYETHYLENE GLYCOL 3350 17 G PO PACK
17 | Freq: Every day | ORAL | Status: DC
Start: 2024-02-10 — End: 2024-02-12
  Administered 2024-02-10 – 2024-02-11 (×2): 17 g via ORAL

## 2024-02-10 MED ORDER — SENNOSIDES 8.6 MG PO TABS
8.6 | Freq: Two times a day (BID) | ORAL | Status: DC
Start: 2024-02-10 — End: 2024-02-13
  Administered 2024-02-10 – 2024-02-13 (×7): 8.6 via ORAL

## 2024-02-10 MED FILL — SENNA-LAX 8.6 MG PO TABS: 8.6 mg | ORAL | Qty: 1 | Fill #0

## 2024-02-10 MED FILL — MORPHINE SULFATE 2 MG/ML IJ SOLN: 2 mg/mL | INTRAMUSCULAR | Qty: 1 | Fill #0

## 2024-02-10 MED FILL — OXYCODONE-ACETAMINOPHEN 5-325 MG PO TABS: 5-325 mg | ORAL | Qty: 1 | Fill #0

## 2024-02-10 MED FILL — MIRALAX 17 G PO PACK: 17 g | ORAL | Qty: 1 | Fill #0

## 2024-02-10 MED FILL — VANCOMYCIN HCL 1.25 G IV SOLR: 1.25 g | INTRAVENOUS | Qty: 1250 | Fill #0

## 2024-02-10 MED FILL — METHADONE HCL 10 MG/ML PO CONC: 10 mg/mL | ORAL | Qty: 15 | Fill #0

## 2024-02-10 MED FILL — PANTOPRAZOLE SODIUM 20 MG PO TBEC: 20 mg | ORAL | Qty: 1 | Fill #0

## 2024-02-10 MED FILL — ACETAMINOPHEN 325 MG PO TABS: 325 mg | ORAL | Qty: 2 | Fill #0

## 2024-02-10 NOTE — Progress Notes (Addendum)
 Baig, Zafar, Santacroce, Khan, Mostafa, Buck, Murtagh Jr  Urology Progress Note     Chief Complaint: Right renal abscess    Subjective:  Did spike a fever overnight to 100.9, other vitals stable  Pain level: Moderate  Denies fever, chills, nausea, vomiting, chest pain, shortness of breath    AM labs pending    Patient Vitals for the past 24 hrs:   BP Temp Temp src Pulse Resp SpO2 Weight   02/10/24 0530 -- 98.1 F (36.7 C) -- -- -- -- 68 kg (149 lb 14.6 oz)   02/10/24 0243 -- -- -- -- 17 -- --   02/10/24 0025 -- 99.5 F (37.5 C) -- -- -- -- --   02/09/24 2150 -- (!) 100.9 F (38.3 C) -- -- -- -- --   02/09/24 2127 -- -- -- -- 18 -- --   02/09/24 2057 -- -- -- -- 18 -- --   02/09/24 2000 136/78 98.6 F (37 C) Oral 68 18 -- --   02/09/24 1940 -- -- -- 68 -- -- --   02/09/24 1548 -- -- -- -- 18 -- --   02/09/24 1505 -- -- -- -- 19 -- --   02/09/24 1230 122/77 97.9 F (36.6 C) Oral 63 18 97 % --   02/09/24 1135 -- -- -- -- 19 -- --   02/09/24 1105 -- -- -- -- 18 -- --   02/09/24 0730 111/76 98.2 F (36.8 C) Oral 54 16 97 % --       Intake/Output Summary (Last 24 hours) at 02/10/2024 0718  Last data filed at 02/10/2024 0622  Gross per 24 hour   Intake 1808.96 ml   Output --   Net 1808.96 ml       Recent Labs     02/07/24  2046 02/09/24  0641   WBC 12.8* 7.8   HGB 11.1* 9.9*   HCT 32.9* 29.7*   MCV 89.6 90.8   PLT 225 246     Recent Labs     02/07/24  2046 02/09/24  0641   NA 131* 135*   K 3.9 4.2   CL 94* 102   CO2 26 25   BUN 5* 5*   CREATININE 0.5* 0.4*       Recent Labs     02/08/24  1229   COLORU Yellow   PHUR 7.0   WBCUA 5 TO 10   RBCUA 2 TO 5   BACTERIA None   LEUKOCYTESUR TRACE*   UROBILINOGEN Normal   BILIRUBINUR NEGATIVE       Additional Lab/culture results:  Ucx     Physical Exam:  Constitutional: Patient in no acute distress.  Neuro: alert and oriented to person place and time.  HEENT: No acute abnormalities  Lungs: Respiratory effort normal on room air  Cardiovascular:  Heart rate regular rate and  rhythm  Abdomen: Soft, non-tender, non-distended. Non peritonitic  Extremities: No leg swelling, no edema  GU: Right CVA tenderness    Interval Imaging Findings:  None    Impression:  58 year old female with 2.5 cm right renal abscess. She does have significant risk factors for hematogenous spread. She denies IV drug use but does have a history of hepatitis C, urine tox was positive for opioids, THC, cocaine. Urine culture from [facility] growing Staph aureus. She is currently on vancomycin  and Rocephin , blood cultures pending. Will manage conservatively at this time given the fact that the abscess is under 3 cm and she is  not immunocompromised.  Reimage did show more defined fluid collection, however, was only on vancomycin  for 1 day.  Will reimage tomorrow, if not responding, will benefit from aspiration and possible drain placement    Plan:  - No procedures today, monitor vitals, did spike fever to 100.9  - Repeat CT abdomen pelvis with IV contrast in 24 hours  - Continue vancomycin  and Rocephin     Alm Cool, MD  Urology Resident, PGY-5

## 2024-02-10 NOTE — Progress Notes (Signed)
 [facility]  Office: [phone]  Marty Fitz, DO, Carlin Gula, DO, Taft Dust, DO, Reyes Blood, DO, Annalee Oppenheim, MD, Renard Sago, MD, Vergia Meter, MD, Rico Gill, MD,  Penne Shams, MD, Loyal Bathe, MD, Zainulabedin Waqar, MD,  Emery Clam, DO, Ozell Fitz, DO, Lucienne Cordial, MD,  Mabel Kemps, DO, Shelda Medico, MD, Inocente Burner, MD, Dilnoor Willy, MD,  Kalyan Jagarlamudi, MD, Korene Muller, MD, Caridad Coca, MD, Rosi Odea, MD, Margretta Kays, MD, Consepcion Heady, MD, Toribio Hole, DO, Robbi Buss, MD, Vincenza Fairly, DO, Wynell Lash, MD, Ronal Lennox, MD, Emery Blossom, MD, Mohsin Blossom, MD, Damian Bjork, MD, Orlean Bart, CNP,  Santana Gavel, CNP, Toribio Cooks, CNP,  Mliss Nicely, DNP, Charlene Pang, CNP, Lolita Herald, CNP, Lauraine King, CNP, Addie Lass, CNP, Tinnie Acre, PA-C, Aimee Miester, CNP, Shirlene Pouch, CNP,  Mathilda Meager, CNP, Devere Ingle, CNP, Oliva Magnuson, PA-C, Russell, PA-C, Romero Dam, CNP, Yancy Mulch, CNP, Jeoffrey Ada, CNP,  Emmie Dutton, CNS, Edsel Server, CNP, Rock Sink, CNP         [facility]   IN-PATIENT SERVICE   [facility]    Progress Note    02/10/2024    7:26 AM    Name:   Katherine Mcintosh  MRN:     [id number]     Acct:      [id number]   Room:   [id number]  IP Day:  3  Admit Date:  02/07/2024  8:18 PM    PCP:   Jerilynn Slough, APRN - CNP  Code Status:  Full Code    Subjective:     C/C: Acute pyelonephritis    Interval History Status: improved.     Vitals reviewed, febrile Tmax 100.9 otherwise hemodynamically stable.  Saturating well on room air.  Labs reviewed, leukocytosis 11.4.  Urinalysis consistent with UTI however urine culture with no growth.  Blood cultures negative x 1 day.  MRSA nasal swab positive.  Overnight patient complained of pain.     On examination patient complains of right flank pain and constipation with associated fevers and chills.  Urology  evaluated case with recommendations for n.p.o. for possible IR drainage of abscess however repeat CT imaging demonstrated abscess under 3 cm and patient not amenable compromise therefore management conservatively.  Plan to reimage tomorrow.  Infectious disease following.  Remains on Rocephin  and vancomycin .    Brief History:     This is a 58 year old with a significant past medical history of bipolar disorder, polysubstance abuse on methadone  who initially presented to [facility] with chief complaint of right flank pain and found to have a right sided renal abscess.    Review of Systems:     Constitutional:  positive for chills, fevers, sweats  Respiratory:  positive for shortness of breath due to flank pain with deep inhalation. negative for cough, wheezing  Cardiovascular:  negative for chest pain, chest pressure/discomfort, lower extremity edema, palpitations  Gastrointestinal: positive for abdominal pain and constipation. negative for diarrhea, nausea, vomiting  Neurological:  negative for dizziness, headache    Medications:     Allergies:    Allergies   Allergen Reactions    Penicillins Hives       Current Meds:   Scheduled Meds:    pantoprazole   20 mg Oral QAM AC    methadone   105 mg Oral Daily    vancomycin  (VANCOCIN ) intermittent dosing (placeholder)   Other RX Placeholder    vancomycin   1,250 mg IntraVENous  Q12H    sodium chloride  flush  5-40 mL IntraVENous 2 times per day    [Held by provider] enoxaparin   40 mg SubCUTAneous Daily    cefTRIAXone  (ROCEPHIN ) IV  1,000 mg IntraVENous Q24H     Continuous Infusions:    sodium chloride  125 mL/hr at 02/10/24 9377    sodium chloride        PRN Meds: docusate sodium , melatonin, sodium chloride  flush, sodium chloride , ondansetron  **OR** ondansetron , acetaminophen  **OR** acetaminophen , polyethylene glycol, oxyCODONE -acetaminophen     Data:     Past Medical History:   has a past medical history of Bipolar 1 disorder (HCC).    Social History:   reports that she has  been smoking cigarettes. She has been exposed to tobacco smoke. She does not have any smokeless tobacco history on file. She reports current drug use. Drug: Cocaine.     Family History: No family history on file.    Vitals:  BP 131/80   Pulse 69   Temp 98.4 F (36.9 C) (Oral)   Resp 18   Ht 1.651 m (5' 5)   Wt 68 kg (149 lb 14.6 oz)   SpO2 97%   BMI 24.95 kg/m   Temp (24hrs), Avg:98.8 F (37.1 C), Min:97.9 F (36.6 C), Max:100.9 F (38.3 C)    No results for input(s): POCGLU in the last 72 hours.    I/O (24Hr):    Intake/Output Summary (Last 24 hours) at 02/10/2024 0726  Last data filed at 02/10/2024 0622  Gross per 24 hour   Intake 1808.96 ml   Output --   Net 1808.96 ml       Labs:  Hematology:  Recent Labs     02/07/24  2046 02/08/24  0737 02/09/24  0641   WBC 12.8*  --  7.8   RBC 3.67*  --  3.27*   HGB 11.1*  --  9.9*   HCT 32.9*  --  29.7*   MCV 89.6  --  90.8   MCH 30.2  --  30.3   MCHC 33.7  --  33.3   RDW 12.7  --  12.8   PLT 225  --  246   MPV 10.8  --  11.2   INR  --  1.1  --      Chemistry:  Recent Labs     02/07/24  2046 02/09/24  0641   NA 131* 135*   K 3.9 4.2   CL 94* 102   CO2 26 25   GLUCOSE 94 105*   BUN 5* 5*   CREATININE 0.5* 0.4*   ANIONGAP 11 8*   LABGLOM >90 >90   CALCIUM 8.6 8.6     Recent Labs     02/07/24  2046   TSH 0.87   AST 42*   ALT 48*   ALKPHOS 68   BILITOT 0.4     ABG:No results found for: POCPH, PHART, PH, POCPCO2, PCO2ART, PCO2, POCPO2, PO2ART, PO2, POCHCO3, HCO3ART, HCO3, NBEA, PBEA, BEART, BE, THGBART, THB, TCO2ART, POCO2SAT, O2SATART, O2SAT, FIO2  Lab Results   Component Value Date/Time    SPECIAL LFT ARM 02/08/2024 06:12 PM     Lab Results   Component Value Date/Time    CULTURE NO GROWTH 1 DAY 02/08/2024 06:12 PM       Radiology:  CT ABDOMEN PELVIS W IV CONTRAST Additional Contrast? None  Result Date: 02/09/2024  1. Right renal cortical lesion with associated enhancing thickened rind measuring approximately 2.4 x  2.1 cm and adjacent perinephric hemorrhage or inflammatory change, concerning for renal abscess versus neoplasm. No right-sided hydronephrosis or renal calculus. Further evaluation with MRI may be helpful. 2. No left renal calculus, hydronephrosis, or renal lesion. 3. Moderate to large volume fecal residuals in the colon.     Physical Examination:        General appearance:  alert, cooperative and no distress  Mental Status:  oriented to person, place and time and normal affect  Lungs:  clear to auscultation bilaterally, normal effort  Heart:  regular rate and rhythm, no murmur  Abdomen:  soft and nondistended but tender in the LLQ and LUQ. Bowel sounds hypoactive.   Extremities:  no edema, redness, tenderness in the calves  Skin:  no gross lesions, rashes, induration    Assessment:        Hospital Problems           Last Modified POA    * (Principal) Acute pyelonephritis 02/07/2024 Yes    Acute stress reaction 02/08/2024 Yes    Renal abscess 02/08/2024 Yes     Plan:        Renal abscess.  Infectious disease and urology following.  Urology with no plan for procedure but recommending repeat CT abdomen pelvis with IV contrast in 24 hours.  Infectious disease following and patient remains on Rocephin  and vancomycin  pending culture data.    Bipolar disorder.  Continue supportive care.    History of drug abuse.  Continue methadone  105 mg daily.  Senokot for constipation.    DVT prophylaxis: Lovenox  held in case of procedure following repeat CT abdomen pelvis.  GI prophylaxis: Protonix     Discharge planning: Pending continued improvement as well as further input from urology based on imaging as well as finalization of antibiotics by ID.    Mabel Kemps, DO  02/10/2024  7:26 AM

## 2024-02-10 NOTE — Care Coordination-Inpatient (Signed)
 Case Management   Daily Progress Note       Patient Name: Katherine Mcintosh                   Date of Birth: 03-07-1966  Diagnosis: Acute pyelonephritis [N10]  Renal abscess [N15.1]                       GMLOS: 2.8 days  Length of Stay: 3  days    Anticipated Discharge Date: One day until discharge    Readmission Risk (Low < 19, Mod (19-27), High > 27): Readmission Risk Score: 7        Current Transitional Plan    [x]  Home Independently    []  Home with [facility]    []  Skilled Nursing Facility    []  Acute Rehabilitation    []  Long Term Acute Care (LTAC)    []  Other:     Plan for the Stay (Medical Management) : IV antibiotics. No stop date for the Vancomycin         Workflow Continuation (Additional Notes) :Met with the patient to discuss the transition plan. States she will return home with family.        [facility]  February 10, 2024

## 2024-02-10 NOTE — Plan of Care (Signed)
 Problem: Chronic Conditions and Co-morbidities  Goal: Patient's chronic conditions and co-morbidity symptoms are monitored and maintained or improved  02/10/2024 1822 by Shan Kung, RN  Outcome: Progressing  02/10/2024 1801 by Shan Kung, RN  Outcome: Progressing  02/10/2024 0507 by Joanie Loader  Outcome: Progressing     Problem: Pain  Goal: Verbalizes/displays adequate comfort level or baseline comfort level  02/10/2024 1822 by Shan Kung, RN  Outcome: Progressing  02/10/2024 1801 by Shan Kung, RN  Outcome: Progressing  02/10/2024 0507 by Joanie Loader  Outcome: Progressing     Problem: Genitourinary - Adult  Goal: Absence of urinary retention  02/10/2024 1822 by Shan Kung, RN  Outcome: Progressing  02/10/2024 1801 by Shan Kung, RN  Outcome: Progressing  02/10/2024 0507 by Joanie Loader  Outcome: Progressing     Problem: Infection - Adult  Goal: Absence of infection at discharge  02/10/2024 1822 by Shan Kung, RN  Outcome: Progressing  02/10/2024 1801 by Shan Kung, RN  Outcome: Progressing  02/10/2024 0507 by Joanie Loader  Outcome: Progressing     Problem: Safety - Adult  Goal: Free from fall injury  02/10/2024 1822 by Shan Kung, RN  Outcome: Progressing  02/10/2024 1801 by Shan Kung, RN  Outcome: Progressing  02/10/2024 0507 by Joanie Loader  Outcome: Progressing

## 2024-02-10 NOTE — Progress Notes (Signed)
 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  program  Bipolar disorder  Allergy to Penicillin: Hives    Recommendations:   Continue ceftriaxone  and vancomycin  pending culture data    Medical Decision Making/Summary/Discussion:02/10/2024       Infection Control Recommendations   Universal Precautions    Antimicrobial Stewardship Recommendations     Simplification of therapy  Targeted therapy    Coordination of Outpatient Care:   Estimated Length of IV antimicrobials:TBD  Patient will need Midline Catheter Insertion: TBD  Patient will need PICC line Insertion:TBD  Patient will need: Home IV , Infusion Center,  SNF,  LTAC: TBD  Patient will need outpatient wound care: No    Chief complaint/reason for consultation:   Renal abscess, history of IV drug use, history of TB treatment      History of Present Illness:   Katherine Mcintosh is a 58 y.o.-year-old female who was initially admitted on 02/07/2024. Patient seen at the request of Dr. Hildegard.    INITIAL HISTORY:    This patient has a past medical history of:  Drug abuse, on methadone  program  Bipolar disorder  Hepatitis C    Patient presented to [facility] due to right-sided flank pain on 02/06/24.  CT abdomen showed 2.5 cm fluid collection within the parenchyma of right kidney consistent with right-sided renal abscess.  The patient was transferred to [facility] for further management and need for drain placement by IR.    Initial labs showed urinalysis negative for WBC.    Patient denied any IV drug use, however her urine tox screen was positive for cocaine, THC, opiates.    Her urine culture from the [facility] is growing Staph aureus, no blood cultures obtained.    ID services were consulted for antibiotic recommendation.    CURRENT EVALUATION- DAILY INTERVAL CHANGES 02/10/2024  BP 131/80   Pulse 69   Temp 98.4 F  (36.9 C) (Oral)   Resp 18   Ht 1.651 m (5' 5)   Wt 68 kg (149 lb 14.6 oz)   SpO2 97%   BMI 24.95 kg/m     Afebrile  Vital signs stable  On room air    No complaints over night  Patient had fever 100.9 over night.     Possible IR drainage of abscess if patient destabilizes.   Urology recommends repeat CT abdomen/pelvis with IV contrast in 24hrs    Labs reviewed:  WBC: 12.8 --> 7.8  Hgb: 11.1  --> 9.9  Platelets: 225 --> 246    Medications reviewed:  On vancomycin  and Rocephin       Labs, X rays reviewed: 02/10/2024 with independent review of X rays    I have independently reviewed/ordered the following labs:    CBC with Differential:   Recent Labs     02/09/24  0641 02/10/24  0709   WBC 7.8 11.4*   HGB 9.9* 11.4*   HCT 29.7* 35.0*   PLT 246 345     BMP:   Recent Labs     02/09/24  0641 02/10/24  0709   NA 135* 137   K 4.2 5.0   CL 102 101   CO2 25 27   BUN 5* 3*   CREATININE 0.4* 0.6   MG  --  1.9     Hepatic Function Panel:  Recent Labs     02/07/24  2046   BILITOT 0.4   ALKPHOS 68   ALT 48*   AST 42*     No results for input(s): RPR in the last 72 hours.  No results for input(s): HIV in the last 72 hours.  No results for input(s): BC in the last 72 hours.  Lab Results   Component Value Date/Time    BACTERIA None 02/08/2024 12:29 PM    RBC 3.85 02/10/2024 07:09 AM    WBC 11.4 02/10/2024 07:09 AM    TURBIDITY Clear 02/08/2024 12:29 PM     Lab Results   Component Value Date/Time    CREATININE 0.6 02/10/2024 07:09 AM    GLUCOSE 89 02/10/2024 07:09 AM         Cultures:  Urine:   Urine from [facility]: S aureus  02-08-24: No growth   Blood:   02-08-24: No growth   Sputum :    Wound:    MRSA Nares:  02-08-24: Positive    Imaging:    02-09-2024          Review of Systems:     Pertinent review of symptoms listed in Initial Evaluation and daily interval evaluations sections      Physical Examination :   Patient Vitals for the past 8 hrs:   BP Temp Temp src Pulse Resp SpO2 Weight   02/10/24 0723 131/80 -- -- -- -- --  --   02/10/24 0722 (!) 116/105 98.4 F (36.9 C) Oral 69 18 97 % --   02/10/24 0702 -- -- -- -- 16 -- --   02/10/24 0530 -- 98.1 F (36.7 C) -- -- -- -- 68 kg (149 lb 14.6 oz)   02/10/24 0243 -- -- -- -- 17 -- --   02/10/24 0025 -- 99.5 F (37.5 C) -- -- -- -- --     General Appearance: Awake, alert, and in no apparent distress  Head:  Normocephalic, no trauma  Eyes: Pupils equal, round, reactive to light and accommodation; extraocular movements intact; sclera anicteric; conjunctivae pink. No embolic phenomena.  ENT: Oropharynx clear, without erythema, exudate, or thrush. No tenderness of sinuses. Mouth/throat: mucosa pink and moist. No lesions. Dentition in good repair.  Neck:Supple, without lymphadenopathy. Thyroid normal, No bruits.  Pulmonary/Chest: Clear to auscultation, without wheezes, rales, or rhonchi.  No dullness to percussion.   Cardiovascular: Regular rate and rhythm without murmurs, rubs, or gallops.   Abdomen: Soft, tender on the right CVA  All four Extremities: No cyanosis, clubbing, edema, or effusions.  Neurologic: No gross sensory or motor deficits.  Skin: Warm and dry with good turgor.No signs of peripheral arterial or venous insufficiency. No ulcerations. No open wounds.      I have personally reviewed the past medical history, past surgical history, medications, social history, and family history, and I have updated the database accordingly.    Past Medical History:     Past Medical History:   Diagnosis Date    Bipolar 1 disorder (HCC)        Past Surgical  History:   No past surgical history on file.    Medications:      pantoprazole   20 mg Oral QAM AC    methadone   105 mg Oral Daily    vancomycin  (VANCOCIN ) intermittent dosing (placeholder)   Other RX Placeholder    vancomycin   1,250 mg IntraVENous Q12H    sodium chloride  flush  5-40 mL IntraVENous 2 times per day    [  Held by provider] enoxaparin   40 mg SubCUTAneous Daily    cefTRIAXone  (ROCEPHIN ) IV  1,000 mg IntraVENous Q24H       Social  History:     Social History     Socioeconomic History    Marital status: Unknown     Spouse name: Not on file    Number of children: Not on file    Years of education: Not on file    Highest education level: Not on file   Occupational History    Not on file   Tobacco Use    Smoking status: Every Day     Current packs/day: 0.50     Types: Cigarettes     Passive exposure: Current    Smokeless tobacco: Not on file    Tobacco comments:     Current smoker: 0.5 pack/day x 25 yrs per pt   Substance and Sexual Activity    Alcohol use: Not on file    Drug use: Yes     Types: Cocaine     Comment: pt states using cocaine 3 days ago    Sexual activity: Not on file   Other Topics Concern    Not on file   Social History Narrative    Not on file     Social Drivers of Health     Financial Resource Strain: Medium Risk (09/30/2022)    Received from [facility]    Overall Financial Resource Strain (CARDIA)     Difficulty of Paying Living Expenses: Somewhat hard   Food Insecurity: No Food Insecurity (02/07/2024)    Hunger Vital Sign     Worried About Running Out of Food in the Last Year: Never true     Ran Out of Food in the Last Year: Never true   Transportation Needs: No Transportation Needs (02/07/2024)    PRAPARE - Therapist, art (Medical): No     Lack of Transportation (Non-Medical): No   Physical Activity: Sufficiently Active (11/19/2017)    Received from [facility]    Exercise Vital Sign     Days of Exercise per Week: 6 days     Minutes of Exercise per Session: 60 min   Stress: Stress Concern Present (11/19/2017)    Received from [facility]    [employer] of Occupational Health - Occupational Stress Questionnaire     Feeling of Stress : Rather much   Social Connections: Moderately Integrated (11/19/2017)    Received from [facility]    Social Connection and Isolation Panel     Frequency of Communication with Friends and Family: More than three times a  week     Frequency of Social Gatherings with Friends and Family: Three times a week     Attends Religious Services: More than 4 times per year     Active Member of Clubs or Organizations: Yes     Attends Banker Meetings: More than 4 times per year     Marital Status: Divorced   Intimate Partner Violence: Not At Risk (08/07/2022)    Received from [employer] [facility] [employer], Afraid, Rape, and Kick questionnaire     Within the last year, have you been afraid of your partner or ex-partner?: No     Within the last year, have you been humiliated or emotionally abused in other ways by your partner or ex-partner?: No     Within the last year, have  you been kicked, hit, slapped, or otherwise physically hurt by your partner or ex-partner?: No     Within the last year, have you been raped or forced to have any kind of sexual activity by your partner or ex-partner?: No   Housing Stability: Low Risk  (02/07/2024)    Housing Stability Vital Sign     Unable to Pay for Housing in the Last Year: No     Number of Times Moved in the Last Year: 0     Homeless in the Last Year: No       Family History:   No family history on file.     Allergies:   Penicillins       Medical Decision Making-Imaging:   No results found.    Medical Decision Making-Cultures-Other:     Results       Procedure Component Value Units Date/Time    MRSA DNA Probe, Nasal [[id number]]  (Abnormal) Collected: 02/08/24 1834    Order Status: Completed Specimen: Nasal swab Updated: 02/09/24 0927     Specimen Description .NASAL SWAB     MRSA, DNA, Nasal POSITIVE:  MRSA DNA detected by nucleic acid amplification.     Comment:                                                   Results should be used as an adjunct to nosocomial control efforts to identify patients   needing enhanced precautions.    The test is not intended to identify patients with staphylococcal infections.  Results   should not be used to guide or monitor treatment for MRSA  infections.         Culture, Blood 2 [[id number]] Collected: 02/08/24 1812    Order Status: Completed Specimen: Blood Updated: 02/09/24 1841     Specimen Description .BLOOD     Special Requests LFT ARM     Culture NO GROWTH 1 DAY    Culture, Blood 1 [[id number]] Collected: 02/08/24 1807    Order Status: Completed Specimen: Blood Updated: 02/09/24 1841     Specimen Description .BLOOD     Special Requests RT ARM     Culture NO GROWTH 1 DAY    Culture, Urine [[id number]] Collected: 02/08/24 1709    Order Status: Completed Specimen: Urine, Clean Catch Updated: 02/09/24 1212     Specimen Description .CLEAN CATCH URINE     Special Requests Site: Urine     Culture NO GROWTH    Culture, Blood 1 [[id number]] Collected: 02/08/24 1500    Order Status: Canceled Specimen: Blood     C.trachomatis N.gonorrhoeae DNA, Urine [[id number]] Collected: 02/08/24 1229    Order Status: Completed Specimen: Urine Updated: 02/09/24 1014     Specimen Description .URINE     C. trachomatis DNA ,Urine NEGATIVE     Comment: CHLAMYDIA TRACHOMATIS DNA not detected by nucleic acid amplification.        This test is intended for medical purposes only and is not valid for the evaluation of   suspected sexual abuse or for other forensic purposes.  In certain contexts, culture may be required to meet applicable laws and regulations for   diagnosis of C. trachomatis and N. gonorrhoeae infections.  Per 2014  CDC recommendations, this test does not include confirmation of positive results   by an alternative nucleic  acid target.          N. gonorrhoeae DNA, Urine NEGATIVE     Comment: NEISSERIA GONORRHOEAE DNA not detected by nucleic acid amplification.        This test is intended for medical purposes only and is not valid for the evaluation of   suspected sexual abuse or for other forensic purposes.  In certain contexts, culture may be required to meet applicable laws and regulations for   diagnosis of C. trachomatis and N. gonorrhoeae  infections.  Per 2014  CDC recommendations, this test does not include confirmation of positive results   by an alternative nucleic acid target.                   Medical Decision Making-Other:     Note:      Thank you for allowing us  to participate in the care of this patient. Please call with questions.    Tita Caprice Fox, DPM    ATTESTATION:    I have discussed the case, including pertinent history and exam findings with the medical resident or medical student. I have evaluated the  History, physical findings and pictures of the patient and the key elements of the encounter have been performed by me. I have reviewed the laboratory data, other diagnostic studies and discussed them with the medical resident or student. I have updated the medical record where necessary.    I agree with the assessment, plan and orders as documented by the medical resident or student and I have modified them as necessary.     Sheree Brim, MD    02/10/2024     02/10/2024

## 2024-02-10 NOTE — Plan of Care (Signed)
 Problem: Chronic Conditions and Co-morbidities  Goal: Patient's chronic conditions and co-morbidity symptoms are monitored and maintained or improved  02/10/2024 1801 by Shan Kung, RN  Outcome: Progressing  02/10/2024 0507 by Joanie Loader  Outcome: Progressing     Problem: Pain  Goal: Verbalizes/displays adequate comfort level or baseline comfort level  02/10/2024 1801 by Shan Kung, RN  Outcome: Progressing  02/10/2024 0507 by Joanie Loader  Outcome: Progressing     Problem: Genitourinary - Adult  Goal: Absence of urinary retention  02/10/2024 1801 by Shan Kung, RN  Outcome: Progressing  02/10/2024 0507 by Joanie Loader  Outcome: Progressing     Problem: Infection - Adult  Goal: Absence of infection at discharge  02/10/2024 1801 by Shan Kung, RN  Outcome: Progressing  02/10/2024 0507 by Joanie Loader  Outcome: Progressing     Problem: Safety - Adult  Goal: Free from fall injury  02/10/2024 1801 by Shan Kung, RN  Outcome: Progressing  02/10/2024 0507 by Joanie Loader  Outcome: Progressing

## 2024-02-10 NOTE — Plan of Care (Cosign Needed)
 Problem: Chronic Conditions and Co-morbidities  Goal: Patient's chronic conditions and co-morbidity symptoms are monitored and maintained or improved  02/10/2024 0507 by Joanie Loader  Outcome: Progressing  02/09/2024 1626 by Dolores Hough, RN  Outcome: Progressing     Problem: Pain  Goal: Verbalizes/displays adequate comfort level or baseline comfort level  02/10/2024 0507 by Joanie Loader  Outcome: Progressing  02/09/2024 1626 by Dolores Hough, RN  Outcome: Progressing     Problem: Genitourinary - Adult  Goal: Absence of urinary retention  02/10/2024 0507 by Joanie Loader  Outcome: Progressing  02/09/2024 1626 by Dolores Hough, RN  Outcome: Progressing     Problem: Infection - Adult  Goal: Absence of infection at discharge  02/10/2024 0507 by Joanie Loader  Outcome: Progressing  02/09/2024 1626 by Dolores Hough, RN  Outcome: Progressing     Problem: Safety - Adult  Goal: Free from fall injury  02/10/2024 0507 by Joanie Loader  Outcome: Progressing  02/09/2024 1626 by Dolores Hough, RN  Outcome: Progressing

## 2024-02-11 LAB — CBC WITH AUTO DIFFERENTIAL
Basophils %: 1 % (ref 0–2)
Basophils Absolute: 0.07 k/uL (ref 0.00–0.20)
Eosinophils %: 2 % (ref 1–4)
Eosinophils Absolute: 0.18 k/uL (ref 0.00–0.44)
Hematocrit: 32.1 % — ABNORMAL LOW (ref 36.3–47.1)
Hemoglobin: 10.6 g/dL — ABNORMAL LOW (ref 11.9–15.1)
Immature Granulocytes %: 1 % — ABNORMAL HIGH
Immature Granulocytes Absolute: 0.06 k/uL (ref 0.00–0.30)
Lymphocytes %: 22 % — ABNORMAL LOW (ref 24–43)
Lymphocytes Absolute: 2.34 k/uL (ref 1.10–3.70)
MCH: 29.9 pg (ref 25.2–33.5)
MCHC: 33 g/dL (ref 28.4–34.8)
MCV: 90.4 fL (ref 82.6–102.9)
MPV: 10.7 fL (ref 8.1–13.5)
Monocytes %: 9 % (ref 3–12)
Monocytes Absolute: 0.93 k/uL (ref 0.10–1.20)
NRBC Automated: 0 /100{WBCs}
Neutrophils %: 65 % (ref 36–65)
Neutrophils Absolute: 7.13 k/uL (ref 1.50–8.10)
Platelets: 320 k/uL (ref 138–453)
RBC: 3.55 m/uL — ABNORMAL LOW (ref 3.95–5.11)
RDW: 12.9 % (ref 11.8–14.4)
WBC: 10.7 k/uL (ref 3.5–11.3)

## 2024-02-11 LAB — BASIC METABOLIC PANEL
Anion Gap: 10 mmol/L (ref 9–16)
Anion Gap: 9 mmol/L (ref 9–16)
BUN: 4 mg/dL — ABNORMAL LOW (ref 6–20)
BUN: 3 mg/dL — ABNORMAL LOW (ref 6–20)
CO2: 27 mmol/L (ref 20–31)
CO2: 26 mmol/L (ref 20–31)
Calcium: 8.7 mg/dL (ref 8.6–10.4)
Calcium: 8.4 mg/dL — ABNORMAL LOW (ref 8.6–10.4)
Chloride: 99 mmol/L (ref 98–107)
Chloride: 101 mmol/L (ref 98–107)
Creatinine: 0.6 mg/dL (ref 0.6–0.9)
Creatinine: 0.5 mg/dL — ABNORMAL LOW (ref 0.6–0.9)
Est, Glom Filt Rate: >90 mL/min/1.73m2 (ref >60)
Est, Glom Filt Rate: >90 mL/min/1.73m2 (ref >60)
Glucose: 143 mg/dL — ABNORMAL HIGH (ref 74–99)
Glucose: 97 mg/dL (ref 74–99)
Potassium: 3.7 mmol/L (ref 3.7–5.3)
Potassium: 4 mmol/L (ref 3.7–5.3)
Sodium: 136 mmol/L (ref 136–145)
Sodium: 136 mmol/L (ref 136–145)

## 2024-02-11 LAB — C-REACTIVE PROTEIN: CRP: 97.1 mg/L — ABNORMAL HIGH (ref 0.0–5.0)

## 2024-02-11 LAB — PROTIME-INR
INR: 1.1
Protime: 14.8 s (ref 11.7–14.9)

## 2024-02-11 LAB — MAGNESIUM
Magnesium: 1.9 mg/dL (ref 1.6–2.6)
Magnesium: 1.8 mg/dL (ref 1.6–2.6)

## 2024-02-11 MED ORDER — STERILE WATER FOR INJECTION (MIXTURES ONLY)
1 | INTRAMUSCULAR | Status: DC
Start: 2024-02-11 — End: 2024-02-13
  Administered 2024-02-11 – 2024-02-12 (×2): 1000 mg via INTRAVENOUS

## 2024-02-11 MED FILL — SENNA-LAX 8.6 MG PO TABS: 8.6 mg | ORAL | Qty: 1 | Fill #0

## 2024-02-11 MED FILL — CEFTRIAXONE SODIUM 1 G IJ SOLR: 1 g | INTRAMUSCULAR | Qty: 1000 | Fill #0

## 2024-02-11 MED FILL — PANTOPRAZOLE SODIUM 20 MG PO TBEC: 20 mg | ORAL | Qty: 1 | Fill #0

## 2024-02-11 NOTE — Plan of Care (Signed)
 Problem: Chronic Conditions and Co-morbidities  Goal: Patient's chronic conditions and co-morbidity symptoms are monitored and maintained or improved  02/11/2024 0637 by Leonarda Ros, RN  Outcome: Progressing  02/10/2024 1822 by Shan Kung, RN  Outcome: Progressing  02/10/2024 1801 by Shan Kung, RN  Outcome: Progressing     Problem: Pain  Goal: Verbalizes/displays adequate comfort level or baseline comfort level  02/11/2024 0637 by Leonarda Ros, RN  Outcome: Progressing  02/10/2024 1822 by Shan Kung, RN  Outcome: Progressing  02/10/2024 1801 by Shan Kung, RN  Outcome: Progressing     Problem: Genitourinary - Adult  Goal: Absence of urinary retention  02/11/2024 0637 by Leonarda Ros, RN  Outcome: Progressing  02/10/2024 1822 by Shan Kung, RN  Outcome: Progressing  02/10/2024 1801 by Shan Kung, RN  Outcome: Progressing     Problem: Infection - Adult  Goal: Absence of infection at discharge  02/11/2024 0637 by Leonarda Ros, RN  Outcome: Progressing  02/10/2024 1822 by Shan Kung, RN  Outcome: Progressing  02/10/2024 1801 by Shan Kung, RN  Outcome: Progressing     Problem: Safety - Adult  Goal: Free from fall injury  02/11/2024 0637 by Leonarda Ros, RN  Outcome: Progressing  02/10/2024 1822 by Shan Kung, RN  Outcome: Progressing  02/10/2024 1801 by Shan Kung, RN  Outcome: Progressing

## 2024-02-11 NOTE — Progress Notes (Signed)
 Baig, Zafar, Santacroce, Khan, Mostafa, Buck, Murtagh Jr  Urology Progress Note     Chief Complaint: Right renal abscess    Subjective:  Did spike a fever overnight to 101.3, other vitals stable.  Repeat CT did show increased size of abscess to 3.5 cm.  Pain level: Moderate  Denies fever, chills, nausea, vomiting, chest pain, shortness of breath    WBC 10.7 (10.7)    Patient Vitals for the past 24 hrs:   BP Temp Temp src Pulse Resp SpO2 Weight   02/11/24 0739 113/68 98.8 F (37.1 C) Oral 62 16 93 % --   02/11/24 0709 -- -- -- -- 18 -- --   02/11/24 0600 -- -- -- -- -- -- 68.2 kg (150 lb 5.7 oz)   02/11/24 0345 -- -- -- -- 17 -- --   02/11/24 0300 133/76 100.2 F (37.9 C) Oral 74 18 96 % --   02/10/24 2026 -- -- -- -- 17 -- --   02/10/24 2010 -- -- -- 63 -- -- --   02/10/24 1956 -- -- -- -- 18 -- --   02/10/24 1945 126/74 98.4 F (36.9 C) Oral 63 20 97 % --   02/10/24 1740 (!) 137/93 (!) 101.3 F (38.5 C) Oral 74 22 -- --   02/10/24 1306 -- -- -- -- 18 -- --   02/10/24 0840 -- -- -- -- 16 -- --       Intake/Output Summary (Last 24 hours) at 02/11/2024 0743  Last data filed at 02/11/2024 0647  Gross per 24 hour   Intake 3337.19 ml   Output --   Net 3337.19 ml       Recent Labs     02/10/24  0709 02/10/24  1923 02/11/24  0526   WBC 11.4* 10.7 10.7   HGB 11.4* 10.3* 10.6*   HCT 35.0* 30.5* 32.1*   MCV 90.9 88.9 90.4   PLT 345 348 320     Recent Labs     02/10/24  0709 02/10/24  1923 02/11/24  0526   NA 137 136 136   K 5.0 3.7 4.0   CL 101 99 101   CO2 27 27 26    BUN 3* 4* 3*   CREATININE 0.6 0.6 0.5*       Recent Labs     02/08/24  1229   COLORU Yellow   PHUR 7.0   WBCUA 5 TO 10   RBCUA 2 TO 5   BACTERIA None   LEUKOCYTESUR TRACE*   UROBILINOGEN Normal   BILIRUBINUR NEGATIVE       Additional Lab/culture results:  Ucx Staph aureus    Physical Exam:  Constitutional: Patient in no acute distress.  Neuro: alert and oriented to person place and time.  HEENT: No acute abnormalities  Lungs: Respiratory effort normal on  room air  Cardiovascular:  Heart rate regular rate and rhythm  Abdomen: Soft, non-tender, non-distended. Non peritonitic  Extremities: No leg swelling, no edema  GU: Right CVA tenderness    Interval Imaging Findings:  None    Impression:  58 year old female with 2.5 cm right renal abscess. She does have significant risk factors for hematogenous spread. She denies IV drug use but does have a history of hepatitis C, urine tox was positive for opioids, THC, cocaine. Urine culture from [facility] growing Staph aureus. She is currently on vancomycin  and Rocephin , blood cultures pending.  Did initially manage conservatively but still spiking fevers and having significant pain.  Repeat  CT shows increased size of mass 3.5 cm.  Will plan on IR drainage.    Plan:  - N.p.o., will plan on interventional radiology to drain abscess with either aspiration or indwelling drain  - Continue antibiotics per ID team    Alm Cool, MD  Urology Resident, PGY-5

## 2024-02-11 NOTE — Plan of Care (Signed)
 Urology was informed the IR states that abscess is too small to drain and unable to be placed at this time. We will continue with conservative management at this time. Continue IV antibiotics per ID.

## 2024-02-11 NOTE — Progress Notes (Signed)
 [facility]  Office: [phone]  Marty Fitz, DO, Carlin Gula, DO, Taft Dust, DO, Reyes Blood, DO, Annalee Oppenheim, MD, Renard Sago, MD, Vergia Meter, MD, Rico Gill, MD,  Penne Shams, MD, Loyal Bathe, MD, Zainulabedin Waqar, MD,  Emery Clam, DO, Ozell Fitz, DO, Lucienne Cordial, MD,  Mabel Kemps, DO, Shelda Medico, MD, Inocente Burner, MD, Dilnoor Willy, MD,  Kalyan Jagarlamudi, MD, Korene Muller, MD, Caridad Coca, MD, Rosi Odea, MD, Margretta Kays, MD, Consepcion Heady, MD, Toribio Hole, DO, Robbi Buss, MD, Vincenza Fairly, DO, Wynell Lash, MD, Ronal Lennox, MD, Emery Blossom, MD, Mohsin Blossom, MD, Damian Bjork, MD, Orlean Bart, CNP,  Santana Gavel, CNP, Toribio Cooks, CNP,  Mliss Nicely, DNP, Charlene Pang, CNP, Lolita Herald, CNP, Lauraine King, CNP, Addie Lass, CNP, Tinnie Acre, PA-C, Aimee Miester, CNP, Shirlene Pouch, CNP,  Mathilda Meager, CNP, Devere Ingle, CNP, Oliva Magnuson, PA-C, [place], PA-C, Romero Dam, CNP, Yancy Mulch, CNP, Jeoffrey Ada, CNP,  Emmie Dutton, CNS, Edsel Server, CNP, Rock Sink, CNP         [facility]   IN-PATIENT SERVICE   [facility]    Progress Note    02/11/2024    7:17 AM    Name:   Katherine Mcintosh  MRN:     [id number]     Acct:      [id number]   Room:   [id number]  IP Day:  4  Admit Date:  02/07/2024  8:18 PM    PCP:   Jerilynn Slough, APRN - CNP  Code Status:  Full Code    Subjective:     C/C: Acute pyelonephritis    Interval History Status: improved.     Vitals reviewed, febrile Tmax 101.3 otherwise hemodynamically stable. Saturating well on room air.  Labs reviewed, CRP elevated 97.1, no leukocytosis, hemoglobin stable.  Urinalysis consistent with UTI however urine culture with no growth.  Blood cultures negative x 2 day.  MRSA nasal swab positive.  Overnight patient complained of pain.     On examination patient continues to complain of right flank pain otherwise feeling  okay.  Increased fever yesterday for which repeat CT was obtained and redemonstrated concern for renal abscess.  Will discuss with urology regarding IR drainage today > Ir consulted for evaluation.     Brief History:     This is a 58 year old with a significant past medical history of bipolar disorder, polysubstance abuse on methadone  who initially presented to [facility] with chief complaint of right flank pain and found to have a right sided renal abscess.    Review of Systems:     Constitutional:  positive for chills, fevers, sweats  Respiratory:  positive for shortness of breath due to flank pain with deep inhalation. negative for cough, wheezing  Cardiovascular:  negative for chest pain, chest pressure/discomfort, lower extremity edema, palpitations  Gastrointestinal: positive for abdominal pain. negative for diarrhea, nausea, vomiting, constipation.   Neurological:  negative for dizziness, headache    Medications:     Allergies:    Allergies   Allergen Reactions    Penicillins Hives       Current Meds:   Scheduled Meds:    senna  1 tablet Oral BID    polyethylene glycol  17 g Oral Daily    pantoprazole   20 mg Oral QAM AC    methadone   105 mg Oral Daily    vancomycin  (VANCOCIN ) intermittent dosing Leisure centre manager)   Other Control and instrumentation engineer  vancomycin   1,250 mg IntraVENous Q12H    sodium chloride  flush  5-40 mL IntraVENous 2 times per day    [Held by provider] enoxaparin   40 mg SubCUTAneous Daily    cefTRIAXone  (ROCEPHIN ) IV  1,000 mg IntraVENous Q24H     Continuous Infusions:    sodium chloride  125 mL/hr at 02/11/24 0651    sodium chloride        PRN Meds: morphine , docusate sodium , melatonin, sodium chloride  flush, sodium chloride , ondansetron  **OR** ondansetron , acetaminophen  **OR** acetaminophen , oxyCODONE -acetaminophen     Data:     Past Medical History:   has a past medical history of Bipolar 1 disorder (HCC).    Social History:   reports that she has been smoking cigarettes. She has been exposed to  tobacco smoke. She does not have any smokeless tobacco history on file. She reports current drug use. Drug: Cocaine.     Family History: No family history on file.    Vitals:  BP 133/76   Pulse 74   Temp 100.2 F (37.9 C) (Oral)   Resp 18   Ht 1.651 m (5' 5)   Wt 68.2 kg (150 lb 5.7 oz)   SpO2 96%   BMI 25.02 kg/m   Temp (24hrs), Avg:99.6 F (37.6 C), Min:98.4 F (36.9 C), Max:101.3 F (38.5 C)    No results for input(s): POCGLU in the last 72 hours.    I/O (24Hr):    Intake/Output Summary (Last 24 hours) at 02/11/2024 0717  Last data filed at 02/11/2024 0647  Gross per 24 hour   Intake 3337.19 ml   Output --   Net 3337.19 ml       Labs:  Hematology:  Recent Labs     02/08/24  0737 02/09/24  0641 02/10/24  0709 02/10/24  1923 02/11/24  0526   WBC  --    < > 11.4* 10.7 10.7   RBC  --    < > 3.85* 3.43* 3.55*   HGB  --    < > 11.4* 10.3* 10.6*   HCT  --    < > 35.0* 30.5* 32.1*   MCV  --    < > 90.9 88.9 90.4   MCH  --    < > 29.6 30.0 29.9   MCHC  --    < > 32.6 33.8 33.0   RDW  --    < > 12.8 12.7 12.9   PLT  --    < > 345 348 320   MPV  --    < > 10.9 11.0 10.7   CRP  --   --   --  97.1*  --    INR 1.1  --   --   --   --     < > = values in this interval not displayed.     Chemistry:  Recent Labs     02/10/24  0709 02/10/24  1923 02/11/24  0526   NA 137 136 136   K 5.0 3.7 4.0   CL 101 99 101   CO2 27 27 26    GLUCOSE 89 143* 97   BUN 3* 4* 3*   CREATININE 0.6 0.6 0.5*   MG 1.9 1.9 1.8   ANIONGAP 9 10 9    LABGLOM >90 >90 >90   CALCIUM 9.1 8.7 8.4*     No results for input(s): LABALBU, LABA1C, T3TOTAL, FT4, TSH, AST, ALT, LDH, GGT, ALKPHOS, BILITOT, BILIDIR, AMMONIA, AMYLASE, LIPASE, LACTATE, CHOL, HDL, CHOLHDLRATIO, TRIG,  VLDL, HIV12AB, PHENYTOIN, PHENYF, URICACID, POCGLU in the last 72 hours.    Invalid input(s): PROT, T4TOTAL, LABGGT, LDLCHOLESTEROL    ABG:No results found for: POCPH, PHART, PH, POCPCO2, PCO2ART, PCO2, POCPO2,  PO2ART, PO2, POCHCO3, HCO3ART, HCO3, NBEA, PBEA, BEART, BE, THGBART, THB, TCO2ART, POCO2SAT, O2SATART, O2SAT, FIO2  Lab Results   Component Value Date/Time    SPECIAL LFT ARM 02/08/2024 06:12 PM     Lab Results   Component Value Date/Time    CULTURE NO GROWTH 2 DAYS 02/08/2024 06:12 PM       Radiology:  CT ABDOMEN PELVIS W IV CONTRAST Additional Contrast? None  Result Date: 02/09/2024  1. Right renal cortical lesion with associated enhancing thickened rind measuring approximately 2.4 x 2.1 cm and adjacent perinephric hemorrhage or inflammatory change, concerning for renal abscess versus neoplasm. No right-sided hydronephrosis or renal calculus. Further evaluation with MRI may be helpful. 2. No left renal calculus, hydronephrosis, or renal lesion. 3. Moderate to large volume fecal residuals in the colon.     Physical Examination:        General appearance:  alert, cooperative and no distress  Mental Status:  oriented to person, place and time and normal affect  Lungs:  clear to auscultation bilaterally, normal effort  Heart:  regular rate and rhythm, no murmur  Abdomen:  soft and nondistended but tender in the LUQ. Bowel sounds hypoactive.   Extremities:  no edema, redness, tenderness in the calves  Skin:  no gross lesions, rashes, induration    Assessment:        Hospital Problems           Last Modified POA    * (Principal) Acute pyelonephritis 02/07/2024 Yes    Acute stress reaction 02/08/2024 Yes    Renal abscess 02/08/2024 Yes     Plan:        Renal abscess.  Infectious disease and urology following. Increased pain and fevers 02/10/24. Discussed with urology plan for IR evaluation today for drainage vs drain placement. Infectious disease following and patient remains on Rocephin  and vancomycin  pending culture data. Methadone  and Percocet  for pain. Morphine  for breakthrough pain only.     Bipolar disorder.  Continue supportive care.    History of drug abuse.  Continue methadone  105  mg daily.  Senokot for constipation.    DVT prophylaxis: Lovenox  held in case of procedure following repeat CT abdomen pelvis.  GI prophylaxis: Protonix     Discharge planning: Pending IR evaluation and culture data.     Mabel Kemps, DO  02/11/2024  7:17 AM

## 2024-02-11 NOTE — Plan of Care (Signed)
 Problem: Chronic Conditions and Co-morbidities  Goal: Patient's chronic conditions and co-morbidity symptoms are monitored and maintained or improved  02/11/2024 1756 by Shan Kung, RN  Outcome: Progressing  02/11/2024 0637 by Leonarda Ros, RN  Outcome: Progressing     Problem: Pain  Goal: Verbalizes/displays adequate comfort level or baseline comfort level  02/11/2024 1756 by Shan Kung, RN  Outcome: Progressing  02/11/2024 0637 by Leonarda Ros, RN  Outcome: Progressing     Problem: Genitourinary - Adult  Goal: Absence of urinary retention  02/11/2024 1756 by Shan Kung, RN  Outcome: Progressing  02/11/2024 0637 by Leonarda Ros, RN  Outcome: Progressing     Problem: Infection - Adult  Goal: Absence of infection at discharge  02/11/2024 1756 by Shan Kung, RN  Outcome: Progressing  02/11/2024 0637 by Leonarda Ros, RN  Outcome: Progressing     Problem: Safety - Adult  Goal: Free from fall injury  02/11/2024 1756 by Shan Kung, RN  Outcome: Progressing  02/11/2024 0637 by Leonarda Ros, RN  Outcome: Progressing

## 2024-02-11 NOTE — Progress Notes (Signed)
 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  Bipolar disorder  Allergy to Penicillin: Hives    Recommendations:   Continue ceftriaxone  and vancomycin  pending culture data  Plan for IR drain of abscess with aspiration or indwelling drain 02-11-2024    Medical Decision Making/Summary/Discussion:02/11/2024       Infection Control Recommendations   Universal Precautions    Antimicrobial Stewardship Recommendations     Simplification of therapy  Targeted therapy    Coordination of Outpatient Care:   Estimated Length of IV antimicrobials:TBD  Patient will need Midline Catheter Insertion: TBD  Patient will need PICC line Insertion:TBD  Patient will need: Home IV , Infusion Center,  SNF,  LTAC: TBD  Patient will need outpatient wound care: No    Chief complaint/reason for consultation:   Renal abscess, history of IV drug use, history of TB treatment      History of Present Illness:   Katherine Mcintosh is a 58 y.o.-year-old female who was initially admitted on 02/07/2024. Patient seen at the request of Dr. Hildegard.    INITIAL HISTORY:    This patient has a past medical history of:  Drug abuse, on methadone  program  Bipolar disorder  Hepatitis C    Patient presented to [facility] due to right-sided flank pain on 02/06/24.  CT abdomen showed 2.5 cm fluid collection within the parenchyma of right kidney consistent with right-sided renal abscess.  The patient was transferred to [facility] for further management and need for drain placement by IR.    Initial labs showed urinalysis negative for WBC.    Patient denied any IV drug use, however her urine tox screen was positive for cocaine, THC, opiates.    Her urine culture from the [facility] is growing Staph aureus, no blood cultures obtained.    ID services were consulted for antibiotic recommendation.    CURRENT EVALUATION-  DAILY INTERVAL CHANGES 02/11/2024  BP 113/68   Pulse 62   Temp 98.8 F (37.1 C) (Oral)   Resp 16   Ht 1.651 m (5' 5)   Wt 68.2 kg (150 lb 5.7 oz)   SpO2 93%   BMI 25.02 kg/m     Afebrile  Vital signs stable  On room air    No complaints over night  Patient had fever 100.9 over night.     Plan for IR drain of abscess with aspiration or indwelling drain 02-11-2024.   Urology recommends repeat CT abdomen/pelvis with IV contrast in 24hrs    Labs reviewed:  WBC: 12.8 --> 7.8 --> 11.4 --> 10.7  Hgb: 11.1  --> 9.9  Platelets: 225 --> 246    Medications reviewed:  On vancomycin  and Rocephin       Labs, X rays reviewed: 02/11/2024 with independent review of X rays    I have independently reviewed/ordered the following labs:    CBC with Differential:   Recent Labs     02/10/24  1923 02/11/24  0526   WBC 10.7 10.7   HGB 10.3* 10.6*   HCT 30.5* 32.1*   PLT 348 320   LYMPHOPCT 20* 22*   MONOPCT 8 9   EOSPCT 2 2     BMP:   Recent Labs     02/10/24  1923 02/11/24  0526   NA 136 136   K  3.7 4.0   CL 99 101   CO2 27 26   BUN 4* 3*   CREATININE 0.6 0.5*   MG 1.9 1.8     Hepatic Function Panel:   No results for input(s): LABALBU, BILIDIR, IBILI, BILITOT, ALKPHOS, ALT, AST in the last 72 hours.    Invalid input(s): PROT    No results for input(s): RPR in the last 72 hours.  No results for input(s): HIV in the last 72 hours.  No results for input(s): BC in the last 72 hours.  Lab Results   Component Value Date/Time    BACTERIA None 02/08/2024 12:29 PM    RBC 3.55 02/11/2024 05:26 AM    WBC 10.7 02/11/2024 05:26 AM    TURBIDITY Clear 02/08/2024 12:29 PM     Lab Results   Component Value Date/Time    CREATININE 0.5 02/11/2024 05:26 AM    GLUCOSE 97 02/11/2024 05:26 AM         Cultures:  Urine:   Urine from [facility]: S aureus  02-08-24: No growth   Blood:   02-08-24: No growth   Sputum :    Wound:    MRSA Nares:  02-08-24: Positive    Imaging:    02-09-2024          Review of Systems:     Pertinent review of  symptoms listed in Initial Evaluation and daily interval evaluations sections      Physical Examination :   Patient Vitals for the past 8 hrs:   BP Temp Temp src Pulse Resp SpO2 Weight   02/11/24 0739 113/68 98.8 F (37.1 C) Oral 62 16 93 % --   02/11/24 0709 -- -- -- -- 18 -- --   02/11/24 0600 -- -- -- -- -- -- 68.2 kg (150 lb 5.7 oz)   02/11/24 0345 -- -- -- -- 17 -- --   02/11/24 0300 133/76 100.2 F (37.9 C) Oral 74 18 96 % --     General Appearance: Awake, alert, and in no apparent distress  Head:  Normocephalic, no trauma  Eyes: Pupils equal, round, reactive to light and accommodation; extraocular movements intact; sclera anicteric; conjunctivae pink. No embolic phenomena.  ENT: Oropharynx clear, without erythema, exudate, or thrush. No tenderness of sinuses. Mouth/throat: mucosa pink and moist. No lesions. Dentition in good repair.  Neck:Supple, without lymphadenopathy. Thyroid normal, No bruits.  Pulmonary/Chest: Clear to auscultation, without wheezes, rales, or rhonchi.  No dullness to percussion.   Cardiovascular: Regular rate and rhythm without murmurs, rubs, or gallops.   Abdomen: Soft, tender on the right CVA  All four Extremities: No cyanosis, clubbing, edema, or effusions.  Neurologic: No gross sensory or motor deficits.  Skin: Warm and dry with good turgor.No signs of peripheral arterial or venous insufficiency. No ulcerations. No open wounds.      I have personally reviewed the past medical history, past surgical history, medications, social history, and family history, and I have updated the database accordingly.    Past Medical History:     Past Medical History:   Diagnosis Date    Bipolar 1 disorder (HCC)        Past Surgical  History:   No past surgical history on file.    Medications:      senna  1 tablet Oral BID    polyethylene glycol  17 g Oral Daily    pantoprazole   20 mg Oral QAM AC    methadone   105 mg Oral Daily  vancomycin  (VANCOCIN ) intermittent dosing (placeholder)   Other RX  Placeholder    vancomycin   1,250 mg IntraVENous Q12H    sodium chloride  flush  5-40 mL IntraVENous 2 times per day    [Held by provider] enoxaparin   40 mg SubCUTAneous Daily    cefTRIAXone  (ROCEPHIN ) IV  1,000 mg IntraVENous Q24H       Social History:     Social History     Socioeconomic History    Marital status: Unknown     Spouse name: Not on file    Number of children: Not on file    Years of education: Not on file    Highest education level: Not on file   Occupational History    Not on file   Tobacco Use    Smoking status: Every Day     Current packs/day: 0.50     Types: Cigarettes     Passive exposure: Current    Smokeless tobacco: Not on file    Tobacco comments:     Current smoker: 0.5 pack/day x 25 yrs per pt   Substance and Sexual Activity    Alcohol use: Not on file    Drug use: Yes     Types: Cocaine     Comment: pt states using cocaine 3 days ago    Sexual activity: Not on file   Other Topics Concern    Not on file   Social History Narrative    Not on file     Social Drivers of Health     Financial Resource Strain: Medium Risk (09/30/2022)    Received from [facility]    Overall Financial Resource Strain (CARDIA)     Difficulty of Paying Living Expenses: Somewhat hard   Food Insecurity: No Food Insecurity (02/07/2024)    Hunger Vital Sign     Worried About Running Out of Food in the Last Year: Never true     Ran Out of Food in the Last Year: Never true   Transportation Needs: No Transportation Needs (02/07/2024)    PRAPARE - Therapist, art (Medical): No     Lack of Transportation (Non-Medical): No   Physical Activity: Sufficiently Active (11/19/2017)    Received from [facility]    Exercise Vital Sign     Days of Exercise per Week: 6 days     Minutes of Exercise per Session: 60 min   Stress: Stress Concern Present (11/19/2017)    Received from [facility]    [employer] of Occupational Health - Occupational Stress Questionnaire     Feeling  of Stress : Rather much   Social Connections: Moderately Integrated (11/19/2017)    Received from [facility]    Social Connection and Isolation Panel     Frequency of Communication with Friends and Family: More than three times a week     Frequency of Social Gatherings with Friends and Family: Three times a week     Attends Religious Services: More than 4 times per year     Active Member of Clubs or Organizations: Yes     Attends Banker Meetings: More than 4 times per year     Marital Status: Divorced   Intimate Partner Violence: Not At Risk (08/07/2022)    Received from [employer] [facility] [employer], Afraid, Rape, and Kick questionnaire     Within the last year, have you been afraid of your partner  or ex-partner?: No     Within the last year, have you been humiliated or emotionally abused in other ways by your partner or ex-partner?: No     Within the last year, have you been kicked, hit, slapped, or otherwise physically hurt by your partner or ex-partner?: No     Within the last year, have you been raped or forced to have any kind of sexual activity by your partner or ex-partner?: No   Housing Stability: Low Risk  (02/07/2024)    Housing Stability Vital Sign     Unable to Pay for Housing in the Last Year: No     Number of Times Moved in the Last Year: 0     Homeless in the Last Year: No       Family History:   No family history on file.     Allergies:   Penicillins       Medical Decision Making-Imaging:   No results found.    Medical Decision Making-Cultures-Other:     Results       Procedure Component Value Units Date/Time    Culture, Anaerobic and Aerobic [[id number]]     Order Status: No result Specimen: Kidney     MRSA DNA Probe, Nasal [[id number]]  (Abnormal) Collected: 02/08/24 1834    Order Status: Completed Specimen: Nasal swab Updated: 02/09/24 0927     Specimen Description .NASAL SWAB     MRSA, DNA, Nasal POSITIVE:  MRSA DNA detected by nucleic acid amplification.      Comment:                                                   Results should be used as an adjunct to nosocomial control efforts to identify patients   needing enhanced precautions.    The test is not intended to identify patients with staphylococcal infections.  Results   should not be used to guide or monitor treatment for MRSA infections.         Culture, Blood 2 [[id number]] Collected: 02/08/24 1812    Order Status: Completed Specimen: Blood Updated: 02/10/24 1841     Specimen Description .BLOOD     Special Requests LFT ARM     Culture NO GROWTH 2 DAYS    Culture, Blood 1 [[id number]] Collected: 02/08/24 1807    Order Status: Completed Specimen: Blood Updated: 02/10/24 1841     Specimen Description .BLOOD     Special Requests RT ARM     Culture NO GROWTH 2 DAYS    Culture, Urine [[id number]] Collected: 02/08/24 1709    Order Status: Completed Specimen: Urine, Clean Catch Updated: 02/09/24 1212     Specimen Description .CLEAN CATCH URINE     Special Requests Site: Urine     Culture NO GROWTH    Culture, Blood 1 [[id number]] Collected: 02/08/24 1500    Order Status: Canceled Specimen: Blood     C.trachomatis N.gonorrhoeae DNA, Urine [[id number]] Collected: 02/08/24 1229    Order Status: Completed Specimen: Urine Updated: 02/09/24 1014     Specimen Description .URINE     C. trachomatis DNA ,Urine NEGATIVE     Comment: CHLAMYDIA TRACHOMATIS DNA not detected by nucleic acid amplification.        This test is intended for medical purposes only and is not valid for the evaluation of  suspected sexual abuse or for other forensic purposes.  In certain contexts, culture may be required to meet applicable laws and regulations for   diagnosis of C. trachomatis and N. gonorrhoeae infections.  Per 2014  CDC recommendations, this test does not include confirmation of positive results   by an alternative nucleic acid target.          N. gonorrhoeae DNA, Urine NEGATIVE     Comment: NEISSERIA GONORRHOEAE DNA not detected  by nucleic acid amplification.        This test is intended for medical purposes only and is not valid for the evaluation of   suspected sexual abuse or for other forensic purposes.  In certain contexts, culture may be required to meet applicable laws and regulations for   diagnosis of C. trachomatis and N. gonorrhoeae infections.  Per 2014  CDC recommendations, this test does not include confirmation of positive results   by an alternative nucleic acid target.                   Medical Decision Making-Other:     Note:      Thank you for allowing us  to participate in the care of this patient. Please call with questions.      Tita Caprice Fox, DPM    ATTESTATION:    I have discussed the case, including pertinent history and exam findings with the medical resident or medical student. I have evaluated the  History, physical findings and pictures of the patient and the key elements of the encounter have been performed by me. I have reviewed the laboratory data, other diagnostic studies and discussed them with the medical resident or student. I have updated the medical record where necessary.    I agree with the assessment, plan and orders as documented by the medical resident or student and I have modified them as necessary.     Sheree Brim, MD    02/11/2024

## 2024-02-12 LAB — CBC WITH AUTO DIFFERENTIAL
Basophils %: 1 % (ref 0–2)
Basophils Absolute: 0.05 k/uL (ref 0.00–0.20)
Eosinophils %: 2 % (ref 1–4)
Eosinophils Absolute: 0.15 k/uL (ref 0.00–0.44)
Hematocrit: 31.2 % — ABNORMAL LOW (ref 36.3–47.1)
Hemoglobin: 10.3 g/dL — ABNORMAL LOW (ref 11.9–15.1)
Immature Granulocytes %: 0 %
Immature Granulocytes Absolute: 0.04 k/uL (ref 0.00–0.30)
Lymphocytes %: 25 % (ref 24–43)
Lymphocytes Absolute: 2.25 k/uL (ref 1.10–3.70)
MCH: 30.6 pg (ref 25.2–33.5)
MCHC: 33 g/dL (ref 28.4–34.8)
MCV: 92.6 fL (ref 82.6–102.9)
MPV: 11.1 fL (ref 8.1–13.5)
Monocytes %: 9 % (ref 3–12)
Monocytes Absolute: 0.79 k/uL (ref 0.10–1.20)
NRBC Automated: 0 /100{WBCs}
Neutrophils %: 63 % (ref 36–65)
Neutrophils Absolute: 5.79 k/uL (ref 1.50–8.10)
Platelets: 321 k/uL (ref 138–453)
RBC: 3.37 m/uL — ABNORMAL LOW (ref 3.95–5.11)
RDW: 12.9 % (ref 11.8–14.4)
WBC: 9.1 k/uL (ref 3.5–11.3)

## 2024-02-12 LAB — BASIC METABOLIC PANEL
Anion Gap: 8 mmol/L — ABNORMAL LOW (ref 9–16)
BUN: 4 mg/dL — ABNORMAL LOW (ref 6–20)
CO2: 29 mmol/L (ref 20–31)
Calcium: 8.5 mg/dL — ABNORMAL LOW (ref 8.6–10.4)
Chloride: 100 mmol/L (ref 98–107)
Creatinine: 0.5 mg/dL — ABNORMAL LOW (ref 0.6–0.9)
Est, Glom Filt Rate: >90 mL/min/1.73m2 (ref >60)
Glucose: 101 mg/dL — ABNORMAL HIGH (ref 74–99)
Potassium: 4.5 mmol/L (ref 3.7–5.3)
Sodium: 137 mmol/L (ref 136–145)

## 2024-02-12 LAB — MAGNESIUM: Magnesium: 2 mg/dL (ref 1.6–2.6)

## 2024-02-12 LAB — C-REACTIVE PROTEIN: CRP: 85.8 mg/L — ABNORMAL HIGH (ref 0.0–5.0)

## 2024-02-12 MED ORDER — POLYETHYLENE GLYCOL 3350 17 G PO PACK
17 | Freq: Two times a day (BID) | ORAL | Status: DC
Start: 2024-02-12 — End: 2024-02-13
  Administered 2024-02-12 – 2024-02-13 (×3): 17 g via ORAL

## 2024-02-12 MED ORDER — OXYCODONE-ACETAMINOPHEN 5-325 MG PO TABS
5-325 | Freq: Four times a day (QID) | ORAL | Status: DC | PRN
Start: 2024-02-12 — End: 2024-02-13
  Administered 2024-02-12 – 2024-02-13 (×5): 1 via ORAL

## 2024-02-12 MED FILL — OXYCODONE-ACETAMINOPHEN 5-325 MG PO TABS: 5-325 mg | ORAL | Qty: 1 | Fill #0

## 2024-02-12 MED FILL — MIRALAX 17 G PO PACK: 17 g | ORAL | Qty: 1 | Fill #0

## 2024-02-12 MED FILL — CEFTRIAXONE SODIUM 1 G IJ SOLR: 1 g | INTRAMUSCULAR | Qty: 1000 | Fill #0

## 2024-02-12 NOTE — Progress Notes (Signed)
 Physician Progress Note      PATIENT:               Katherine Mcintosh, Katherine Mcintosh  CSN #:                  [id number]  DOB:                       Oct 07, 1965  ADMIT DATE:       02/07/2024 8:18 PM  DISCH DATE:  RANDEE  PROVIDER #:        Mabel Kemps DO          QUERY TEXT:    Renal abscess is documented in the H/P.  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   Saturating well on room air.  Labs reviewed, CRP elevated 97.1, no leukocytosis, hemoglobin stable.  Urinalysis consistent with UTI however urine culture with no growth.  Blood cultures negative x 2 day.  MRSA nasal swab positive.  Overnight patient complained of pain.    On examination patient continues to complain of right flank pain otherwise   feeling okay.  Increased fever yesterday for which repeat CT was obtained and   redemonstrated concern for renal abscess.  Will discuss with urology regarding   IR drainage today > Ir consulted for evaluation.    Treatments IV Rocephin /Vanco labs monitor CT ABD Consult s ID/Urology  Options provided:  -- Sepsis, present on admission  -- Renal abscess without sepsis  -- Other - I will add my own diagnosis  -- Disagree - Not applicable / Not valid  -- Refer to Clinical Documentation Reviewer    PROVIDER RESPONSE TEXT:    This patient has Renal abscess without sepsis.    Query created by: Bjorn Rockers on 02/12/2024 8:28 AM      Electronically signed by:  Mabel Kemps DO 02/12/2024 8:39 AM

## 2024-02-12 NOTE — Progress Notes (Signed)
 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     Medical Decision Making/Summary/Discussion:02/12/2024       Infection Control Recommendations   Universal Precautions    Antimicrobial Stewardship Recommendations     Simplification of therapy  Targeted therapy    Coordination of Outpatient Care:   Estimated Length of IV antimicrobials:TBD  Patient will need Midline Catheter Insertion: TBD  Patient will need PICC line Insertion:TBD  Patient will need: Home IV , Infusion Center,  SNF,  LTAC: TBD  Patient will need outpatient wound care: No    Chief complaint/reason for consultation:   Renal abscess, history of IV drug use, history of TB treatment      History of Present Illness:   Katherine Mcintosh is a 58 y.o.-year-old female who was initially admitted on 02/07/2024. Patient seen at the request of Dr. Hildegard.    INITIAL HISTORY:    This patient has a past medical history of:  Drug abuse, on methadone  program  Bipolar disorder  Hepatitis C    Patient presented to [facility] due to right-sided flank pain on 02/06/24.  CT abdomen showed 2.5 cm fluid collection within the parenchyma of right kidney consistent with right-sided renal abscess.  The patient was transferred to [facility] for further management and need for drain placement by IR.    Initial labs showed urinalysis negative for WBC.    Patient denied any IV drug use, however her urine tox screen was positive for cocaine, THC, opiates.    Her urine culture from the [facility] is growing Staph aureus, no blood cultures obtained.    ID services were consulted for antibiotic recommendation.    CURRENT EVALUATION- DAILY INTERVAL CHANGES  02/12/2024  BP 99/70   Pulse 64   Temp 98.1 F (36.7 C) (Oral)   Resp 17   Ht 1.651 m (5' 5)   Wt 69 kg (152 lb 1.9 oz)   SpO2 97%   BMI 25.31 kg/m     Afebrile  Vital signs stable  On room air    No complaints over night  Patient had fever 100.9 over night.     Plan for IR drain of abscess with aspiration or indwelling drain 02-11-2024, IR states abscess is too small to drain and were unable to place drain at this time. They recommend continuation of conservative management.     Urology recommends repeat CT abdomen/pelvis with IV contrast in 24hrs    Labs reviewed:  WBC: 12.8 --> 7.8 --> 11.4 --> 10.7  Hgb: 11.1  --> 9.9  Platelets: 225 --> 246    Medications reviewed:  On vancomycin  and Rocephin       Labs, X rays reviewed: 02/12/2024 with independent review of X rays    I have independently reviewed/ordered the following labs:    CBC with Differential:   Recent Labs     02/10/24  1923 02/11/24  0526   WBC 10.7 10.7   HGB 10.3* 10.6*   HCT 30.5* 32.1*   PLT 348 320   LYMPHOPCT 20* 22*   MONOPCT 8 9   EOSPCT 2 2     BMP:  Recent Labs     02/10/24  1923 02/11/24  0526   NA 136 136   K 3.7 4.0   CL 99 101   CO2 27 26   BUN 4* 3*   CREATININE 0.6 0.5*   MG 1.9 1.8     Hepatic Function Panel:   No results for input(s): LABALBU, BILIDIR, IBILI, BILITOT, ALKPHOS, ALT, AST in the last 72 hours.    Invalid input(s): PROT    No results for input(s): RPR in the last 72 hours.  No results for input(s): HIV in the last 72 hours.  No results for input(s): BC in the last 72 hours.  Lab Results   Component Value Date/Time    BACTERIA None 02/08/2024 12:29 PM    RBC 3.55 02/11/2024 05:26 AM    WBC 10.7 02/11/2024 05:26 AM    TURBIDITY Clear 02/08/2024 12:29 PM     Lab Results   Component Value Date/Time    CREATININE 0.5 02/11/2024 05:26 AM    GLUCOSE 97 02/11/2024 05:26 AM         Cultures:  Urine:   Urine from [facility]: S aureus  02-08-24: No growth   Blood:   02-08-24: No growth   Sputum  :    Wound:    MRSA Nares:  02-08-24: Positive    Imaging:    02-09-2024          Review of Systems:     Pertinent review of symptoms listed in Initial Evaluation and daily interval evaluations sections      Physical Examination :   Patient Vitals for the past 8 hrs:   BP Temp Temp src Pulse Resp SpO2 Weight   02/12/24 0957 -- -- -- -- 17 -- --   02/12/24 0722 99/70 98.1 F (36.7 C) Oral 64 18 97 % --   02/12/24 0600 -- -- -- -- -- -- 69 kg (152 lb 1.9 oz)   02/12/24 0419 -- -- -- -- 16 -- --   02/12/24 0349 -- -- -- -- 16 -- --     General Appearance: Awake, alert, and in no apparent distress  Head:  Normocephalic, no trauma  Eyes: Pupils equal, round, reactive to light and accommodation; extraocular movements intact; sclera anicteric; conjunctivae pink. No embolic phenomena.  ENT: Oropharynx clear, without erythema, exudate, or thrush. No tenderness of sinuses. Mouth/throat: mucosa pink and moist. No lesions. Dentition in good repair.  Neck:Supple, without lymphadenopathy. Thyroid normal, No bruits.  Pulmonary/Chest: Clear to auscultation, without wheezes, rales, or rhonchi.  No dullness to percussion.   Cardiovascular: Regular rate and rhythm without murmurs, rubs, or gallops.   Abdomen: Soft, tender on the right CVA  All four Extremities: No cyanosis, clubbing, edema, or effusions.  Neurologic: No gross sensory or motor deficits.  Skin: Warm and dry with good turgor.No signs of peripheral arterial or venous insufficiency. No ulcerations. No open wounds.      I have personally reviewed the past medical history, past surgical history, medications, social history, and family history, and I have updated the database accordingly.    Past Medical History:     Past Medical History:   Diagnosis Date    Bipolar 1 disorder (HCC)        Past Surgical  History:   No past surgical history on file.    Medications:      polyethylene glycol  17 g Oral BID    cefTRIAXone  (ROCEPHIN ) IV  1,000 mg IntraVENous Q24H  senna  1  tablet Oral BID    pantoprazole   20 mg Oral QAM AC    methadone   105 mg Oral Daily    vancomycin  (VANCOCIN ) intermittent dosing (placeholder)   Other RX Placeholder    vancomycin   1,250 mg IntraVENous Q12H    sodium chloride  flush  5-40 mL IntraVENous 2 times per day    enoxaparin   40 mg SubCUTAneous Daily    cefTRIAXone  (ROCEPHIN ) IV  1,000 mg IntraVENous Q24H       Social History:     Social History     Socioeconomic History    Marital status: Unknown     Spouse name: Not on file    Number of children: Not on file    Years of education: Not on file    Highest education level: Not on file   Occupational History    Not on file   Tobacco Use    Smoking status: Every Day     Current packs/day: 0.50     Types: Cigarettes     Passive exposure: Current    Smokeless tobacco: Not on file    Tobacco comments:     Current smoker: 0.5 pack/day x 25 yrs per pt   Substance and Sexual Activity    Alcohol use: Not on file    Drug use: Yes     Types: Cocaine     Comment: pt states using cocaine 3 days ago    Sexual activity: Not on file   Other Topics Concern    Not on file   Social History Narrative    Not on file     Social Drivers of Health     Financial Resource Strain: Medium Risk (09/30/2022)    Received from [facility]    Overall Financial Resource Strain (CARDIA)     Difficulty of Paying Living Expenses: Somewhat hard   Food Insecurity: No Food Insecurity (02/07/2024)    Hunger Vital Sign     Worried About Running Out of Food in the Last Year: Never true     Ran Out of Food in the Last Year: Never true   Transportation Needs: No Transportation Needs (02/07/2024)    PRAPARE - Therapist, art (Medical): No     Lack of Transportation (Non-Medical): No   Physical Activity: Sufficiently Active (11/19/2017)    Received from [facility]    Exercise Vital Sign     Days of Exercise per Week: 6 days     Minutes of Exercise per Session: 60 min   Stress: Stress Concern Present (11/19/2017)     Received from [facility]    [employer] of Occupational Health - Occupational Stress Questionnaire     Feeling of Stress : Rather much   Social Connections: Moderately Integrated (11/19/2017)    Received from [facility]    Social Connection and Isolation Panel     Frequency of Communication with Friends and Family: More than three times a week     Frequency of Social Gatherings with Friends and Family: Three times a week     Attends Religious Services: More than 4 times per year     Active Member of Clubs or Organizations: Yes     Attends Banker Meetings: More than 4 times per year     Marital Status: Divorced   Intimate Partner Violence: Not At Risk (08/07/2022)    Received from [employer] [facility]  [employer], Afraid, Rape, and Kick questionnaire     Within the last year, have you been afraid of your partner or ex-partner?: No     Within the last year, have you been humiliated or emotionally abused in other ways by your partner or ex-partner?: No     Within the last year, have you been kicked, hit, slapped, or otherwise physically hurt by your partner or ex-partner?: No     Within the last year, have you been raped or forced to have any kind of sexual activity by your partner or ex-partner?: No   Housing Stability: Low Risk  (02/07/2024)    Housing Stability Vital Sign     Unable to Pay for Housing in the Last Year: No     Number of Times Moved in the Last Year: 0     Homeless in the Last Year: No       Family History:   No family history on file.     Allergies:   Penicillins       Medical Decision Making-Imaging:   No results found.    Medical Decision Making-Cultures-Other:     Results       Procedure Component Value Units Date/Time    Culture, Anaerobic and Aerobic [[id number]]     Order Status: Sent Specimen: Kidney     MRSA DNA Probe, Nasal [[id number]]  (Abnormal) Collected: 02/08/24 1834    Order Status: Completed Specimen: Nasal swab Updated: 02/09/24  0927     Specimen Description .NASAL SWAB     MRSA, DNA, Nasal POSITIVE:  MRSA DNA detected by nucleic acid amplification.     Comment:                                                   Results should be used as an adjunct to nosocomial control efforts to identify patients   needing enhanced precautions.    The test is not intended to identify patients with staphylococcal infections.  Results   should not be used to guide or monitor treatment for MRSA infections.         Culture, Blood 2 [[id number]] Collected: 02/08/24 1812    Order Status: Completed Specimen: Blood Updated: 02/11/24 1841     Specimen Description .BLOOD     Special Requests LFT ARM     Culture NO GROWTH 3 DAYS    Culture, Blood 1 [[id number]] Collected: 02/08/24 1807    Order Status: Completed Specimen: Blood Updated: 02/11/24 1841     Specimen Description .BLOOD     Special Requests RT ARM     Culture NO GROWTH 3 DAYS    Culture, Urine [[id number]] Collected: 02/08/24 1709    Order Status: Completed Specimen: Urine, Clean Catch Updated: 02/09/24 1212     Specimen Description .CLEAN CATCH URINE     Special Requests Site: Urine     Culture NO GROWTH    Culture, Blood 1 [[id number]] Collected: 02/08/24 1500    Order Status: Canceled Specimen: Blood     C.trachomatis N.gonorrhoeae DNA, Urine [[id number]] Collected: 02/08/24 1229    Order Status: Completed Specimen: Urine Updated: 02/09/24 1014     Specimen Description .URINE     C. trachomatis DNA ,Urine NEGATIVE     Comment: CHLAMYDIA TRACHOMATIS DNA not detected by nucleic acid amplification.  This test is intended for medical purposes only and is not valid for the evaluation of   suspected sexual abuse or for other forensic purposes.  In certain contexts, culture may be required to meet applicable laws and regulations for   diagnosis of C. trachomatis and N. gonorrhoeae infections.  Per 2014  CDC recommendations, this test does not include confirmation of positive results   by an  alternative nucleic acid target.          N. gonorrhoeae DNA, Urine NEGATIVE     Comment: NEISSERIA GONORRHOEAE DNA not detected by nucleic acid amplification.        This test is intended for medical purposes only and is not valid for the evaluation of   suspected sexual abuse or for other forensic purposes.  In certain contexts, culture may be required to meet applicable laws and regulations for   diagnosis of C. trachomatis and N. gonorrhoeae infections.  Per 2014  CDC recommendations, this test does not include confirmation of positive results   by an alternative nucleic acid target.                   Medical Decision Making-Other:     Note:      Thank you for allowing us  to participate in the care of this patient. Please call with questions.      Tita Caprice Fox, DPM    ATTESTATION:    I have discussed the case, including pertinent history and exam findings with the medical resident or medical student. I have evaluated the  History, physical findings and pictures of the patient and the key elements of the encounter have been performed by me. I have reviewed the laboratory data, other diagnostic studies and discussed them with the medical resident or student. I have updated the medical record where necessary.    I agree with the assessment, plan and orders as documented by the medical resident or student and I have modified them as necessary.     Sheree Brim, MD    02/12/2024

## 2024-02-12 NOTE — Plan of Care (Signed)
 Problem: Chronic Conditions and Co-morbidities  Goal: Patient's chronic conditions and co-morbidity symptoms are monitored and maintained or improved  02/12/2024 0503 by Leonarda Ros, RN  Outcome: Progressing  02/11/2024 1756 by Shan Kung, RN  Outcome: Progressing     Problem: Pain  Goal: Verbalizes/displays adequate comfort level or baseline comfort level  02/12/2024 0503 by Leonarda Ros, RN  Outcome: Progressing  02/11/2024 1756 by Shan Kung, RN  Outcome: Progressing     Problem: Genitourinary - Adult  Goal: Absence of urinary retention  02/12/2024 0503 by Leonarda Ros, RN  Outcome: Progressing  02/11/2024 1756 by Shan Kung, RN  Outcome: Progressing     Problem: Infection - Adult  Goal: Absence of infection at discharge  02/12/2024 0503 by Leonarda Ros, RN  Outcome: Progressing  02/11/2024 1756 by Shan Kung, RN  Outcome: Progressing     Problem: Safety - Adult  Goal: Free from fall injury  02/12/2024 0503 by Leonarda Ros, RN  Outcome: Progressing  02/11/2024 1756 by Shan Kung, RN  Outcome: Progressing

## 2024-02-12 NOTE — Care Coordination-Inpatient (Signed)
 Case Management   Daily Progress Note       Patient Name: Katherine Mcintosh                   Date of Birth: 26-Mar-1966  Diagnosis: Acute pyelonephritis [N10]  Renal abscess [N15.1]                       GMLOS: 2.8 days  Length of Stay: 5  days    Anticipated Discharge Date: To be determined    Readmission Risk (Low < 19, Mod (19-27), High > 27): Readmission Risk Score: 6.9        Current Transitional Plan    []  Home Independently    []  Home with [facility]    [x]  Skilled Nursing Facility    []  Acute Rehabilitation    []  Long Term Acute Care (LTAC)    []  Other:     Plan for the Stay (Medical Management) : Currently on IV Rocephin  and IV Vanco. No stop date.          Workflow Continuation (Additional Notes) : Plan remains home with family. Daughter will transport. Await ID plan.        Erminio Bonine, RN  February 12, 2024

## 2024-02-12 NOTE — Progress Notes (Signed)
 [facility]  Office: [phone]  Marty Fitz, DO, Carlin Gula, DO, Taft Dust, DO, Reyes Blood, DO, Annalee Oppenheim, MD, Renard Sago, MD, Vergia Meter, MD, Rico Gill, MD,  Penne Shams, MD, Loyal Bathe, MD, Zainulabedin Waqar, MD,  Emery Clam, DO, Ozell Fitz, DO, Lucienne Cordial, MD,  Mabel Kemps, DO, Shelda Medico, MD, Inocente Burner, MD, Dilnoor Willy, MD,  Kalyan Jagarlamudi, MD, Korene Muller, MD, Caridad Coca, MD, Rosi Odea, MD, Margretta Kays, MD, Consepcion Heady, MD, Toribio Hole, DO, Robbi Buss, MD, Vincenza Fairly, DO, Wynell Lash, MD, Ronal Lennox, MD, Emery Blossom, MD, Mohsin Blossom, MD, Damian Bjork, MD, Orlean Bart, CNP,  Santana Gavel, CNP, Toribio Cooks, CNP,  Mliss Nicely, DNP, Charlene Pang, CNP, Lolita Herald, CNP, Lauraine King, CNP, Addie Lass, CNP, Tinnie Acre, PA-C, Aimee Miester, CNP, Shirlene Pouch, CNP,  Mathilda Meager, CNP, Devere Ingle, CNP, Oliva Magnuson, PA-C, Rupert, PA-C, Romero Dam, CNP, Yancy Mulch, CNP, Jeoffrey Ada, CNP,  Emmie Dutton, CNS, Edsel Server, CNP, Rock Sink, CNP         [facility]   IN-PATIENT SERVICE   [facility]    Progress Note    02/12/2024    7:24 AM    Name:   Katherine Mcintosh  MRN:     [id number]     Acct:      [id number]   Room:   [id number]  IP Day:  5  Admit Date:  02/07/2024  8:18 PM    PCP:   Jerilynn Slough, APRN - CNP  Code Status:  Full Code    Subjective:     C/C: Acute pyelonephritis    Interval History Status: improved.     Vitals reviewed, Afebrile and hemodynamically stable. Saturating well on room air.  Labs reviewed, CRP elevated 97.1, no leukocytosis, hemoglobin stable.  Urinalysis consistent with UTI however urine culture with no growth.  Blood cultures negative x 3 day.  MRSA nasal swab positive.  Overnight patient complained of pain.     On examination patient resting comfortably in bed. Pain is improving and fevers have subsided.   Discussed case with urology yesterday and recommended IR drainage versus drain placement.  However, upon IR evaluation too small to safely drain.  Plan to continue covering with antibiotics.  Will discuss final antibiotics with infectious disease.  De-escalate pain medication.  Discharge planning.    Brief History:     This is a 58 year old with a significant past medical history of bipolar disorder, polysubstance abuse on methadone  who initially presented to [facility] with chief complaint of right flank pain and found to have a right sided renal abscess.    Review of Systems:     Constitutional: positive for fatigue. negative for chills, fevers, sweats  Respiratory:  positive for shortness of breath due to flank pain with deep inhalation. negative for cough, wheezing  Cardiovascular:  negative for chest pain, chest pressure/discomfort, lower extremity edema, palpitations  Gastrointestinal: positive for abdominal pain, constipation. negative for diarrhea, nausea, vomiting.   Neurological:  negative for dizziness, headache    Medications:     Allergies:    Allergies   Allergen Reactions    Penicillins Hives       Current Meds:   Scheduled Meds:    cefTRIAXone  (ROCEPHIN ) IV  1,000 mg IntraVENous Q24H    senna  1 tablet Oral BID    polyethylene glycol  17 g Oral Daily    pantoprazole   20 mg Oral  QAM AC    methadone   105 mg Oral Daily    vancomycin  (VANCOCIN ) intermittent dosing (placeholder)   Other RX Placeholder    vancomycin   1,250 mg IntraVENous Q12H    sodium chloride  flush  5-40 mL IntraVENous 2 times per day    enoxaparin   40 mg SubCUTAneous Daily    cefTRIAXone  (ROCEPHIN ) IV  1,000 mg IntraVENous Q24H     Continuous Infusions:    sodium chloride  125 mL/hr at 02/12/24 9361    sodium chloride        PRN Meds: morphine , docusate sodium , melatonin, sodium chloride  flush, sodium chloride , ondansetron  **OR** ondansetron , acetaminophen  **OR** acetaminophen , oxyCODONE -acetaminophen     Data:     Past Medical  History:   has a past medical history of Bipolar 1 disorder (HCC).    Social History:   reports that she has been smoking cigarettes. She has been exposed to tobacco smoke. She does not have any smokeless tobacco history on file. She reports current drug use. Drug: Cocaine.     Family History: No family history on file.    Vitals:  BP 99/70   Pulse 64   Temp 98.1 F (36.7 C)   Resp 16   Ht 1.651 m (5' 5)   Wt 69 kg (152 lb 1.9 oz)   SpO2 97%   BMI 25.31 kg/m   Temp (24hrs), Avg:98.6 F (37 C), Min:98.1 F (36.7 C), Max:99 F (37.2 C)    No results for input(s): POCGLU in the last 72 hours.    I/O (24Hr):    Intake/Output Summary (Last 24 hours) at 02/12/2024 0724  Last data filed at 02/12/2024 9361  Gross per 24 hour   Intake 2751.45 ml   Output --   Net 2751.45 ml       Labs:  Hematology:  Recent Labs     02/10/24  0709 02/10/24  1923 02/11/24  0526 02/11/24  1002   WBC 11.4* 10.7 10.7  --    RBC 3.85* 3.43* 3.55*  --    HGB 11.4* 10.3* 10.6*  --    HCT 35.0* 30.5* 32.1*  --    MCV 90.9 88.9 90.4  --    MCH 29.6 30.0 29.9  --    MCHC 32.6 33.8 33.0  --    RDW 12.8 12.7 12.9  --    PLT 345 348 320  --    MPV 10.9 11.0 10.7  --    CRP  --  97.1*  --   --    INR  --   --   --  1.1     Chemistry:  Recent Labs     02/10/24  0709 02/10/24  1923 02/11/24  0526   NA 137 136 136   K 5.0 3.7 4.0   CL 101 99 101   CO2 27 27 26    GLUCOSE 89 143* 97   BUN 3* 4* 3*   CREATININE 0.6 0.6 0.5*   MG 1.9 1.9 1.8   ANIONGAP 9 10 9    LABGLOM >90 >90 >90   CALCIUM 9.1 8.7 8.4*     No results for input(s): LABALBU, LABA1C, T3TOTAL, FT4, TSH, AST, ALT, LDH, GGT, ALKPHOS, BILITOT, BILIDIR, AMMONIA, AMYLASE, LIPASE, LACTATE, CHOL, HDL, CHOLHDLRATIO, TRIG, VLDL, HIV12AB, PHENYTOIN, PHENYF, URICACID, POCGLU in the last 72 hours.    Invalid input(s): PROT, T4TOTAL, LABGGT, LDLCHOLESTEROL    ABG:No results found for: POCPH, PHART, PH, POCPCO2, PCO2ART, PCO2,  POCPO2, PO2ART, PO2, POCHCO3,  HCO3ART, HCO3, NBEA, PBEA, BEART, BE, THGBART, THB, TCO2ART, POCO2SAT, O2SATART, O2SAT, FIO2  Lab Results   Component Value Date/Time    SPECIAL LFT ARM 02/08/2024 06:12 PM     Lab Results   Component Value Date/Time    CULTURE NO GROWTH 3 DAYS 02/08/2024 06:12 PM     Radiology:  CT ABDOMEN PELVIS W IV CONTRAST Additional Contrast? None  Result Date: 02/09/2024  1. Right renal cortical lesion with associated enhancing thickened rind measuring approximately 2.4 x 2.1 cm and adjacent perinephric hemorrhage or inflammatory change, concerning for renal abscess versus neoplasm. No right-sided hydronephrosis or renal calculus. Further evaluation with MRI may be helpful. 2. No left renal calculus, hydronephrosis, or renal lesion. 3. Moderate to large volume fecal residuals in the colon.     Physical Examination:        General appearance:  alert, cooperative and no distress  Mental Status:  oriented to person, place and time and normal affect  Lungs:  clear to auscultation bilaterally, normal effort  Heart:  regular rate and rhythm, no murmur  Abdomen:  soft and nondistended but tender in the RUQ. Bowel sounds hypoactive.   Extremities:  no edema, redness, tenderness in the calves  Skin:  no gross lesions, rashes, induration    Assessment:        Hospital Problems           Last Modified POA    * (Principal) Acute pyelonephritis 02/07/2024 Yes    Acute stress reaction 02/08/2024 Yes    Renal abscess 02/08/2024 Yes     Plan:        Renal abscess.  Infectious disease and urology following. Increased pain and fevers 02/10/24. Discussed with urology plan for IR evaluation today for drainage vs drain placement. Infectious disease following and patient remains on Rocephin  and vancomycin  pending culture data. Methadone  and Percocet  for pain. Morphine  for breakthrough pain only.     Bipolar disorder.  Continue supportive care.    History of drug abuse.  Continue  methadone  105 mg daily.  Gylcolax and Senokot for constipation.    DVT prophylaxis: Lovenox   GI prophylaxis: Protonix     Discharge planning: Pending weaning pain medication and finalization of antibiotics.     Mabel Kemps, DO  02/12/2024  7:24 AM

## 2024-02-12 NOTE — Plan of Care (Signed)
 Problem: Chronic Conditions and Co-morbidities  Goal: Patient's chronic conditions and co-morbidity symptoms are monitored and maintained or improved  02/12/2024 1748 by Blondie Herring, RN  Outcome: Progressing  02/12/2024 0503 by Leonarda Ros, RN  Outcome: Progressing     Problem: Pain  Goal: Verbalizes/displays adequate comfort level or baseline comfort level  02/12/2024 1748 by Blondie Herring, RN  Outcome: Progressing  02/12/2024 0503 by Leonarda Ros, RN  Outcome: Progressing     Problem: Genitourinary - Adult  Goal: Absence of urinary retention  02/12/2024 1748 by Blondie Herring, RN  Outcome: Progressing  02/12/2024 0503 by Leonarda Ros, RN  Outcome: Progressing     Problem: Infection - Adult  Goal: Absence of infection at discharge  02/12/2024 1748 by Blondie Herring, RN  Outcome: Progressing  02/12/2024 0503 by Leonarda Ros, RN  Outcome: Progressing     Problem: Safety - Adult  Goal: Free from fall injury  02/12/2024 1748 by Blondie Herring, RN  Outcome: Progressing  02/12/2024 0503 by Leonarda Ros, RN  Outcome: Progressing

## 2024-02-12 NOTE — Progress Notes (Signed)
 Baig, Zafar, Santacroce, Khan, Mostafa, Keneth Cherry Raddle  Urology Progress Note     Chief Complaint: Right renal abscess    Subjective:  No fevers overnight  Repeat CT did show increased size of abscess to 3.5 cm. IR was consulted for possible intervention however due to size, believed too small to aspirate and drain    Pain level: Moderate  Denies fever, chills, nausea, vomiting, chest pain, shortness of breath    Labs pending this morning    Patient Vitals for the past 24 hrs:   BP Temp Temp src Pulse Resp SpO2 Weight   02/12/24 0600 -- -- -- -- -- -- 69 kg (152 lb 1.9 oz)   02/12/24 0419 -- -- -- -- 16 -- --   02/12/24 0349 -- -- -- -- 16 -- --   02/11/24 2106 -- -- -- -- 17 -- --   02/11/24 2036 -- -- -- -- 17 -- --   02/11/24 1850 117/73 99 F (37.2 C) Oral 61 16 95 % --   02/11/24 1500 -- -- -- -- 18 -- --   02/11/24 0857 -- -- -- -- 18 -- --   02/11/24 0739 113/68 98.8 F (37.1 C) Oral 62 16 93 % --       Intake/Output Summary (Last 24 hours) at 02/12/2024 0710  Last data filed at 02/12/2024 9361  Gross per 24 hour   Intake 2751.45 ml   Output --   Net 2751.45 ml       Recent Labs     02/10/24  0709 02/10/24  1923 02/11/24  0526   WBC 11.4* 10.7 10.7   HGB 11.4* 10.3* 10.6*   HCT 35.0* 30.5* 32.1*   MCV 90.9 88.9 90.4   PLT 345 348 320     Recent Labs     02/10/24  0709 02/10/24  1923 02/11/24  0526   NA 137 136 136   K 5.0 3.7 4.0   CL 101 99 101   CO2 27 27 26    BUN 3* 4* 3*   CREATININE 0.6 0.6 0.5*       No results for input(s): COLORU, PHUR, LABCAST, WBCUA, RBCUA, MUCUS, TRICHOMONAS, YEAST, BACTERIA, CLARITYU, SPECGRAV, LEUKOCYTESUR, UROBILINOGEN, BILIRUBINUR, BLOODU in the last 72 hours.    Invalid input(s): NITRATE, GLUCOSEUKETONESUAMORPHOUS      Additional Lab/culture results:  Ucx Staph aureus    Physical Exam:  Constitutional: Patient in no acute distress.  Neuro: alert and oriented to person place and time.  HEENT: No acute abnormalities  Lungs: Respiratory  effort normal on room air  Cardiovascular:  Heart rate regular rate and rhythm  Abdomen: Soft, non-tender, non-distended. Non peritonitic  Extremities: No leg swelling, no edema  GU: Right CVA tenderness    Interval Imaging Findings:  None    Impression:  58 year old female with 2.5 cm right renal abscess. She does have significant risk factors for hematogenous spread. She denies IV drug use but does have a history of hepatitis C, urine tox was positive for opioids, THC, cocaine. Urine culture from [facility] growing Staph aureus. She is currently on vancomycin  and Rocephin , blood cultures pending.  Did initially manage conservatively but still spiking fevers and having significant pain.  Repeat CT shows increased size of mass 3.5 cm.  Will plan on IR drainage.    Plan:  -No acute surgical intervention required at this time  - IR unable to drain due to size, will continue to monitor and treated with antibiotics  -  Continue antibiotics per ID team      Milo Curd, MD  Urology Resident, PGY-3

## 2024-02-13 LAB — BASIC METABOLIC PANEL
Anion Gap: 13 mmol/L (ref 9–16)
BUN: 3 mg/dL — ABNORMAL LOW (ref 6–20)
CO2: 22 mmol/L (ref 20–31)
Calcium: 8.5 mg/dL — ABNORMAL LOW (ref 8.6–10.4)
Chloride: 100 mmol/L (ref 98–107)
Creatinine: 0.5 mg/dL — ABNORMAL LOW (ref 0.6–0.9)
Est, Glom Filt Rate: >90 mL/min/1.73m2 (ref >60)
Glucose: 122 mg/dL — ABNORMAL HIGH (ref 74–99)
Potassium: 3.9 mmol/L (ref 3.7–5.3)
Sodium: 135 mmol/L — ABNORMAL LOW (ref 136–145)

## 2024-02-13 LAB — CULTURE, BLOOD 1: Culture: NO GROWTH

## 2024-02-13 LAB — CBC WITH AUTO DIFFERENTIAL
Basophils %: 1 % (ref 0–2)
Basophils Absolute: 0.06 k/uL (ref 0.00–0.20)
Eosinophils %: 3 % (ref 1–4)
Eosinophils Absolute: 0.21 k/uL (ref 0.00–0.44)
Hematocrit: 35 % — ABNORMAL LOW (ref 36.3–47.1)
Hemoglobin: 10.9 g/dL — ABNORMAL LOW (ref 11.9–15.1)
Immature Granulocytes %: 1 % — ABNORMAL HIGH
Immature Granulocytes Absolute: 0.05 k/uL (ref 0.00–0.30)
Lymphocytes %: 31 % (ref 24–43)
Lymphocytes Absolute: 2.28 k/uL (ref 1.10–3.70)
MCH: 30 pg (ref 25.2–33.5)
MCHC: 31.1 g/dL (ref 28.4–34.8)
MCV: 96.4 fL (ref 82.6–102.9)
MPV: 10.7 fL (ref 8.1–13.5)
Monocytes %: 10 % (ref 3–12)
Monocytes Absolute: 0.72 k/uL (ref 0.10–1.20)
NRBC Automated: 0 /100{WBCs}
Neutrophils %: 54 % (ref 36–65)
Neutrophils Absolute: 4.03 k/uL (ref 1.50–8.10)
Platelets: 329 k/uL (ref 138–453)
RBC: 3.63 m/uL — ABNORMAL LOW (ref 3.95–5.11)
RDW: 13 % (ref 11.8–14.4)
WBC: 7.4 k/uL (ref 3.5–11.3)

## 2024-02-13 LAB — CULTURE, BLOOD 2: Culture: NO GROWTH

## 2024-02-13 MED ORDER — PANTOPRAZOLE SODIUM 20 MG PO TBEC
20 | ORAL_TABLET | Freq: Every day | ORAL | 3 refills | 30.00000 days | Status: AC
Start: 2024-02-13 — End: ?
  Filled 2024-02-13: qty 30, 30d supply, fill #0

## 2024-02-13 MED ORDER — SENNOSIDES 8.6 MG PO TABS
8.6 | ORAL_TABLET | Freq: Two times a day (BID) | ORAL | 0 refills | 30.00000 days | Status: AC
Start: 2024-02-13 — End: 2024-03-14
  Filled 2024-02-13: qty 60, 30d supply, fill #0

## 2024-02-13 MED ORDER — ONDANSETRON 4 MG PO TBDP
4 | ORAL_TABLET | Freq: Three times a day (TID) | ORAL | 0 refills | 7.00000 days | Status: AC | PRN
Start: 2024-02-13 — End: ?
  Filled 2024-02-13: qty 15, 5d supply, fill #0

## 2024-02-13 MED ORDER — DSS 100 MG PO CAPS
100 | ORAL_CAPSULE | Freq: Two times a day (BID) | ORAL | 0 refills | Status: AC | PRN
Start: 2024-02-13 — End: ?

## 2024-02-13 MED ORDER — LINEZOLID 600 MG PO TABS
600 | ORAL_TABLET | Freq: Two times a day (BID) | ORAL | 0 refills | 10.00000 days | Status: AC
Start: 2024-02-13 — End: 2024-03-12
  Filled 2024-02-13: qty 56, 28d supply, fill #0

## 2024-02-13 MED ORDER — METHADONE HCL 10 MG/ML PO CONC
10 | Freq: Every day | ORAL | 0 refills | 28.00000 days | Status: AC
Start: 2024-02-13 — End: 2024-02-21

## 2024-02-13 MED ORDER — METHADONE HCL 10 MG/ML PO CONC
10 | Freq: Every day | ORAL | 0 refills | 28.00000 days | Status: DC
Start: 2024-02-13 — End: 2024-02-13

## 2024-02-13 MED ORDER — OXYCODONE-ACETAMINOPHEN 5-325 MG PO TABS
5-325 | ORAL_TABLET | Freq: Four times a day (QID) | ORAL | 0 refills | 6.00000 days | Status: AC | PRN
Start: 2024-02-13 — End: 2024-02-16
  Filled 2024-02-13: qty 5, 1d supply, fill #0

## 2024-02-13 MED FILL — OXYCODONE-ACETAMINOPHEN 5-325 MG PO TABS: 5-325 mg | ORAL | Qty: 1 | Fill #0

## 2024-02-13 MED FILL — MIRALAX 17 G PO PACK: 17 g | ORAL | Qty: 1 | Fill #0

## 2024-02-13 MED FILL — DOCUSATE SODIUM 100MG CAPS: 100 100 MG | ORAL | 15 days supply | Qty: 30 | Fill #0 | Status: AC

## 2024-02-13 NOTE — Progress Notes (Signed)
 Comprehensive Nutrition Assessment    Type and Reason for Visit:  Initial, LOS    Nutrition Recommendations/Plan:   Continue current diet as tolerated   Will monitor PO intake, wt, GI status, and POC     Malnutrition Assessment:  Malnutrition Status:  No malnutrition (02/13/24 1150)      Nutrition Assessment:    58 y.o.F admitted d/t renal abscess. No IR drainage at this time. Pt assessed for LOS. Per chart, pt has had wt gain x adm, no hx to assess. PO intake reported as 75-100% of meals. Pt reports no BM x 3 wks, is receiving senokot and glycolax . No acute nutrition concerns or interventions at this time. RD will continue to monitor per protocol.    Nutrition Related Findings:    Meds/labs reviewed. Wound Type: None       Current Nutrition Intake & Therapies:    Average Meal Intake: 76-100%  Average Supplements Intake: None Ordered  ADULT DIET; Regular    Anthropometric Measures:  Height: 165.1 cm (5' 5)  Ideal Body Weight (IBW): 125 lbs (57 kg)    Current Body Weight: 69 kg (152 lb 1.9 oz), 121.7 % IBW.    Current BMI (kg/m2): 25.3     Estimated Daily Nutrient Needs:  Energy Requirements Based On: Formula  Weight Used for Energy Requirements: Current  Energy (kcal/day): 8340-8143 kcals/day  Weight Used for Protein Requirements: Current  Protein (g/day): 59-69 g/day  Method Used for Fluid Requirements: 1 ml/kcal  Fluid (ml/day): 1600-1800 ml/day    Nutrition Diagnosis:   No nutrition diagnosis at this time     Nutrition Interventions:   Food and/or Nutrient Delivery: Continue Current Diet  Nutrition Education/Counseling: No recommendation at this time  Coordination of Nutrition Care: Continue to monitor while inpatient     Nutrition Monitoring and Evaluation:   Behavioral-Environmental Outcomes: None Identified  Food/Nutrient Intake Outcomes: Food and Nutrient Intake  Physical Signs/Symptoms Outcomes: Biochemical Data, Constipation, Meal Time Behavior, Weight    Discharge Planning:    No discharge needs at this  time     Therisa Pinal, RDN, LD, MS  Contact: [phone]

## 2024-02-13 NOTE — Plan of Care (Signed)
 Problem: Chronic Conditions and Co-morbidities  Goal: Patient's chronic conditions and co-morbidity symptoms are monitored and maintained or improved  02/13/2024 0315 by Randle Campbell, RN  Outcome: Progressing  02/12/2024 1748 by Blondie Herring, RN  Outcome: Progressing     Problem: Pain  Goal: Verbalizes/displays adequate comfort level or baseline comfort level  02/13/2024 0315 by Randle Campbell, RN  Outcome: Progressing  02/12/2024 1748 by Blondie Herring, RN  Outcome: Progressing     Problem: Genitourinary - Adult  Goal: Absence of urinary retention  02/13/2024 0315 by Randle Campbell, RN  Outcome: Progressing  02/12/2024 1748 by Blondie Herring, RN  Outcome: Progressing     Problem: Infection - Adult  Goal: Absence of infection at discharge  02/13/2024 0315 by Randle Campbell, RN  Outcome: Progressing  02/12/2024 1748 by Blondie Herring, RN  Outcome: Progressing     Problem: Safety - Adult  Goal: Free from fall injury  02/13/2024 0315 by Randle Campbell, RN  Outcome: Progressing  02/12/2024 1748 by Blondie Herring, RN  Outcome: Progressing

## 2024-02-13 NOTE — Plan of Care (Signed)
 Problem: Chronic Conditions and Co-morbidities  Goal: Patient's chronic conditions and co-morbidity symptoms are monitored and maintained or improved  02/13/2024 1626 by Dolores Hough, RN  Outcome: Progressing  02/13/2024 0315 by Randle Campbell, RN  Outcome: Progressing     Problem: Pain  Goal: Verbalizes/displays adequate comfort level or baseline comfort level  02/13/2024 1626 by Dolores Hough, RN  Outcome: Progressing  02/13/2024 0315 by Randle Campbell, RN  Outcome: Progressing     Problem: Genitourinary - Adult  Goal: Absence of urinary retention  02/13/2024 1626 by Dolores Hough, RN  Outcome: Progressing  02/13/2024 0315 by Randle Campbell, RN  Outcome: Progressing     Problem: Infection - Adult  Goal: Absence of infection at discharge  02/13/2024 1626 by Dolores Hough, RN  Outcome: Progressing  02/13/2024 0315 by Randle Campbell, RN  Outcome: Progressing     Problem: Safety - Adult  Goal: Free from fall injury  02/13/2024 1626 by Dolores Hough, RN  Outcome: Progressing  02/13/2024 0315 by Randle Campbell, RN  Outcome: Progressing

## 2024-02-13 NOTE — Plan of Care (Signed)
 Urology consulted for right renal abscess.  We did manage conservatively, ID recommended antibiotics.  She was reimaged with CT and if this did show some growth of the abscess at 3.5 cm.  IR was consulted but felt there was no drainable collection.  At this point, she has improved on IV antibiotics and is not spiking fevers.  Will plan to reimage as an outpatient in 6 to 8 weeks.  Please reach out to urology if there is a change in clinical status.  Will plan to see her at [facility] urology staff clinic.    Alm Cool, MD  Urology Resident, PGY-5

## 2024-02-13 NOTE — Progress Notes (Signed)
 CLINICAL PHARMACY NOTE: MEDS TO BEDS    Total # of Prescriptions Filled: 1   The following medications were delivered to the patient:  ondansetron     Additional Documentation:  X1 delivered to patient room 345 on 9/19 at 4:58p.     (2nd delivery - sent after 1st delivery (med rec & discharge were already in)

## 2024-02-13 NOTE — Discharge Summary (Signed)
 Discussed discharge instructions with patient including medications and follow up appointments. All question's fully answered. Patient will call me if any problems arise. All patient belongings discharged with patient. IV removed without any complications. Medications discharged with patient. Pt educated on follow up and follow up with methadone  clinic in [place]. Pt walked downstairs to be transported home by friend.

## 2024-02-13 NOTE — Care Coordination-Inpatient (Signed)
 Discharge Report    [facility]  Clinical Case Management Department  Written by: Erminio Bonine, RN    Patient Name: Katherine Mcintosh  Attending Provider: Patti, Dilnoor, MD  Admit Date: 02/07/2024  8:18 PM  MRN: [id number]  Account: [id number]                     DOB: [birth year]  Discharge Date: 02/13/2024      Disposition: home    Erminio Bonine, RN

## 2024-02-13 NOTE — Progress Notes (Signed)
 CLINICAL PHARMACY NOTE: MEDS TO BEDS    Total # of Prescriptions Filled: 5   The following medications were delivered to the patient:  Senna  Docusate   Oxycodone -acet 5-325   Linezolid    Pantoprazole      Additional Documentation:  Delivered x5 to patient room 345 on 9/19 at 4:20p. $0.     (MD re-sending methadone  to home pharmacy- not delivered)

## 2024-02-13 NOTE — Progress Notes (Signed)
 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 Please call [phone] for appointment   Drainage:  IR states abscess too small to drain  Urology to follow up in clinic    Medical Decision Making/Summary/Discussion:02/13/2024       Infection Control Recommendations   Universal Precautions    Antimicrobial Stewardship Recommendations     Simplification of therapy  Targeted therapy    Coordination of Outpatient Care:   Estimated Length of IV antimicrobials:TBD  Patient will need Midline Catheter Insertion: TBD  Patient will need PICC line Insertion:TBD  Patient will need: Home IV , Infusion Center,  SNF,  LTAC: TBD  Patient will need outpatient wound care: No    Chief complaint/reason for consultation:   Renal abscess, history of IV drug use, history of TB treatment      History of Present Illness:   Katherine Mcintosh is a 59 y.o.-year-old female who was initially admitted on 02/07/2024. Patient seen at the request of Dr. Hildegard.    INITIAL HISTORY:    This patient has a past medical history of:  Drug abuse, on methadone  program  Bipolar disorder  Hepatitis C    Patient presented to [facility] due to right-sided flank pain on 02/06/24.  CT abdomen showed 2.5 cm fluid collection within the parenchyma of right kidney consistent with right-sided renal abscess.  The patient was transferred to [facility] for further management and need for drain placement by IR.    Initial labs showed urinalysis negative for WBC.    Patient denied any IV drug use, however her urine tox screen was positive for  cocaine, THC, opiates.    Her urine culture from the [facility] is growing Staph aureus MRSA, no blood cultures obtained.    ID services were consulted for antibiotic recommendation.    CURRENT EVALUATION- DAILY INTERVAL CHANGES 02/13/2024  BP 137/85   Pulse 56   Temp 98.2 F (36.8 C) (Oral)   Resp 17   Ht 1.651 m (5' 5)   Wt 69 kg (152 lb 1.9 oz)   SpO2 98%   BMI 25.31 kg/m     Afebrile  VS stable  On room air    No complaints overnight  Patient had fever 100.9 over night.     Plan for IR drain of abscess with aspiration or indwelling drain 02-11-2024, IR states abscess is too small to drain and were unable to place drain at this time. They recommend continuation of conservative management.     Urology recommends continued antibiotics and re-evaluation in clinic in a few weeks    [facility] urine culture MRSA      Labs reviewed:  WBC: 12.8 --> 7.8 --> 11.4 --> 10.7-->7.4  Hgb: 11.1  --> 9.9-->10.9  Platelets: 225 --> 246    Medications reviewed:  Discontinue ceftriaxone   Continue vancomycin  while in hospital  Linezolid  600 mg po BID would be a potential option for treatment of MRSA renal abscess x 4 weeks    Office f/up in 4  weeks with Dr Doll for infection. Please call [phone] for appointment       Labs, X rays reviewed: 02/13/2024 with independent review of X rays    I have independently reviewed/ordered the following labs:    CBC with Differential:   Recent Labs     02/12/24  1523 02/13/24  0631   WBC 9.1 7.4   HGB 10.3* 10.9*   HCT 31.2* 35.0*   PLT 321 329   LYMPHOPCT 25 31   MONOPCT 9 10   EOSPCT 2 3     BMP:   Recent Labs     02/11/24  0526 02/12/24  1523 02/13/24  0631   NA 136 137 135*   K 4.0 4.5 3.9   CL 101 100 100   CO2 26 29 22    BUN 3* 4* 3*   CREATININE 0.5* 0.5* 0.5*   MG 1.8 2.0  --      Hepatic Function Panel:   No results for input(s): LABALBU, BILIDIR, IBILI, BILITOT, ALKPHOS, ALT, AST in the last 72 hours.    Invalid input(s): PROT    No results for  input(s): RPR in the last 72 hours.  No results for input(s): HIV in the last 72 hours.  No results for input(s): BC in the last 72 hours.  Lab Results   Component Value Date/Time    BACTERIA None 02/08/2024 12:29 PM    RBC 3.63 02/13/2024 06:31 AM    WBC 7.4 02/13/2024 06:31 AM    TURBIDITY Clear 02/08/2024 12:29 PM     Lab Results   Component Value Date/Time    CREATININE 0.5 02/13/2024 06:31 AM    GLUCOSE 122 02/13/2024 06:31 AM         Cultures:  Urine:   Urine from [facility]: S aureus  02-08-24: No growth   Blood:   02-08-24: No growth   Sputum :    Wound:    MRSA Nares:  02-08-24: Positive    Imaging:    02-09-2024          Review of Systems:     Pertinent review of symptoms listed in Initial Evaluation and daily interval evaluations sections      Physical Examination :   Patient Vitals for the past 8 hrs:   BP Temp Temp src Pulse Resp SpO2   02/13/24 0833 -- -- -- -- 17 --   02/13/24 0813 137/85 98.2 F (36.8 C) Oral 56 18 98 %   02/13/24 0738 -- -- -- -- 17 --     General Appearance: Awake, alert, and in no apparent distress  Head:  Normocephalic, no trauma  Eyes: Pupils equal, round, reactive to light and accommodation; extraocular movements intact; sclera anicteric; conjunctivae pink. No embolic phenomena.  ENT: Oropharynx clear, without erythema, exudate, or thrush. No tenderness of sinuses. Mouth/throat: mucosa pink and moist. No lesions. Dentition in good repair.  Neck:Supple, without lymphadenopathy. Thyroid normal, No bruits.  Pulmonary/Chest: Clear to auscultation, without wheezes, rales, or rhonchi.  No dullness to percussion.   Cardiovascular: Regular rate and rhythm without murmurs, rubs, or gallops.   Abdomen: Soft, tender on the right CVA  All four Extremities: No cyanosis, clubbing, edema, or effusions.  Neurologic: No gross sensory or motor deficits.  Skin: Warm and dry with good turgor.No signs of peripheral arterial or venous insufficiency. No ulcerations. No open wounds.      I have  personally reviewed the past medical history, past surgical history, medications, social  history, and family history, and I have updated the database accordingly.    Past Medical History:     Past Medical History:   Diagnosis Date    Bipolar 1 disorder (HCC)        Past Surgical  History:   No past surgical history on file.    Medications:      polyethylene glycol  17 g Oral BID    cefTRIAXone  (ROCEPHIN ) IV  1,000 mg IntraVENous Q24H    senna  1 tablet Oral BID    pantoprazole   20 mg Oral QAM AC    methadone   105 mg Oral Daily    vancomycin  (VANCOCIN ) intermittent dosing (placeholder)   Other RX Placeholder    vancomycin   1,250 mg IntraVENous Q12H    sodium chloride  flush  5-40 mL IntraVENous 2 times per day    enoxaparin   40 mg SubCUTAneous Daily       Social History:     Social History     Socioeconomic History    Marital status: Unknown     Spouse name: Not on file    Number of children: Not on file    Years of education: Not on file    Highest education level: Not on file   Occupational History    Not on file   Tobacco Use    Smoking status: Every Day     Current packs/day: 0.50     Types: Cigarettes     Passive exposure: Current    Smokeless tobacco: Not on file    Tobacco comments:     Current smoker: 0.5 pack/day x 25 yrs per pt   Substance and Sexual Activity    Alcohol use: Not on file    Drug use: Yes     Types: Cocaine     Comment: pt states using cocaine 3 days ago    Sexual activity: Not on file   Other Topics Concern    Not on file   Social History Narrative    Not on file     Social Drivers of Health     Financial Resource Strain: Medium Risk (09/30/2022)    Received from [facility]    Overall Financial Resource Strain (CARDIA)     Difficulty of Paying Living Expenses: Somewhat hard   Food Insecurity: No Food Insecurity (02/07/2024)    Hunger Vital Sign     Worried About Running Out of Food in the Last Year: Never true     Ran Out of Food in the Last Year: Never true   Transportation Needs:  No Transportation Needs (02/07/2024)    PRAPARE - Therapist, art (Medical): No     Lack of Transportation (Non-Medical): No   Physical Activity: Sufficiently Active (11/19/2017)    Received from [facility]    Exercise Vital Sign     Days of Exercise per Week: 6 days     Minutes of Exercise per Session: 60 min   Stress: Stress Concern Present (11/19/2017)    Received from [facility]    [employer] of Occupational Health - Occupational Stress Questionnaire     Feeling of Stress : Rather much   Social Connections: Moderately Integrated (11/19/2017)    Received from [facility]    Social Connection and Isolation Panel     Frequency of Communication with Friends and Family: More than three times a week     Frequency of Social  Gatherings with Friends and Family: Three times a week     Attends Religious Services: More than 4 times per year     Active Member of Clubs or Organizations: Yes     Attends Banker Meetings: More than 4 times per year     Marital Status: Divorced   Intimate Partner Violence: Not At Risk (08/07/2022)    Received from [employer] [facility] [employer], Afraid, Rape, and Kick questionnaire     Within the last year, have you been afraid of your partner or ex-partner?: No     Within the last year, have you been humiliated or emotionally abused in other ways by your partner or ex-partner?: No     Within the last year, have you been kicked, hit, slapped, or otherwise physically hurt by your partner or ex-partner?: No     Within the last year, have you been raped or forced to have any kind of sexual activity by your partner or ex-partner?: No   Housing Stability: Low Risk  (02/07/2024)    Housing Stability Vital Sign     Unable to Pay for Housing in the Last Year: No     Number of Times Moved in the Last Year: 0     Homeless in the Last Year: No       Family History:   No family history on file.     Allergies:    Penicillins       Medical Decision Making-Imaging:   No results found.    Medical Decision Making-Cultures-Other:     Results       Procedure Component Value Units Date/Time    Culture, Anaerobic and Aerobic [[id number]]     Order Status: Sent Specimen: Kidney     MRSA DNA Probe, Nasal [[id number]]  (Abnormal) Collected: 02/08/24 1834    Order Status: Completed Specimen: Nasal swab Updated: 02/09/24 0927     Specimen Description .NASAL SWAB     MRSA, DNA, Nasal POSITIVE:  MRSA DNA detected by nucleic acid amplification.     Comment:                                                   Results should be used as an adjunct to nosocomial control efforts to identify patients   needing enhanced precautions.    The test is not intended to identify patients with staphylococcal infections.  Results   should not be used to guide or monitor treatment for MRSA infections.         Culture, Blood 2 [[id number]] Collected: 02/08/24 1812    Order Status: Completed Specimen: Blood Updated: 02/12/24 1841     Specimen Description .BLOOD     Special Requests LFT ARM     Culture NO GROWTH 4 DAYS    Culture, Blood 1 [[id number]] Collected: 02/08/24 1807    Order Status: Completed Specimen: Blood Updated: 02/12/24 1841     Specimen Description .BLOOD     Special Requests RT ARM     Culture NO GROWTH 4 DAYS    Culture, Urine [[id number]] Collected: 02/08/24 1709    Order Status: Completed Specimen: Urine, Clean Catch Updated: 02/09/24 1212     Specimen Description .CLEAN CATCH URINE     Special Requests Site: Urine     Culture NO  GROWTH    Culture, Blood 1 [[id number]] Collected: 02/08/24 1500    Order Status: Canceled Specimen: Blood     C.trachomatis N.gonorrhoeae DNA, Urine [[id number]] Collected: 02/08/24 1229    Order Status: Completed Specimen: Urine Updated: 02/09/24 1014     Specimen Description .URINE     C. trachomatis DNA ,Urine NEGATIVE     Comment: CHLAMYDIA TRACHOMATIS DNA not detected by nucleic acid  amplification.        This test is intended for medical purposes only and is not valid for the evaluation of   suspected sexual abuse or for other forensic purposes.  In certain contexts, culture may be required to meet applicable laws and regulations for   diagnosis of C. trachomatis and N. gonorrhoeae infections.  Per 2014  CDC recommendations, this test does not include confirmation of positive results   by an alternative nucleic acid target.          N. gonorrhoeae DNA, Urine NEGATIVE     Comment: NEISSERIA GONORRHOEAE DNA not detected by nucleic acid amplification.        This test is intended for medical purposes only and is not valid for the evaluation of   suspected sexual abuse or for other forensic purposes.  In certain contexts, culture may be required to meet applicable laws and regulations for   diagnosis of C. trachomatis and N. gonorrhoeae infections.  Per 2014  CDC recommendations, this test does not include confirmation of positive results   by an alternative nucleic acid target.                   Medical Decision Making-Other:     Note:      Thank you for allowing us  to participate in the care of this patient. Please call with questions.    Sheree Brim, MD    02/13/2024

## 2024-02-13 NOTE — Progress Notes (Signed)
 [facility]  Office: [phone]  Marty Fitz, DO, Carlin Gula, DO, Taft Dust, DO, Reyes Blood, DO, Annalee Oppenheim, MD, Renard Sago, MD, Vergia Meter, MD, Rico Gill, MD,  Penne Shams, MD, Loyal Bathe, MD, Zainulabedin Waqar, MD,  Emery Clam, DO, Ozell Fitz, DO, Lucienne Cordial, MD,  Mabel Kemps, DO, Shelda Medico, MD, Inocente Burner, MD, Zein Helbing Willy, MD,  Kalyan Jagarlamudi, MD, Korene Muller, MD, Caridad Coca, MD, Rosi Odea, MD, Margretta Kays, MD, Consepcion Heady, MD, Toribio Hole, DO, Robbi Buss, MD, Vincenza Fairly, DO, Wynell Lash, MD, Ronal Lennox, MD, Emery Blossom, MD, Mohsin Blossom, MD, Damian Bjork, MD, Orlean Bart, CNP,  Santana Gavel, CNP, Toribio Cooks, CNP,  Mliss Nicely, DNP, Charlene Pang, CNP, Lolita Herald, CNP, Lauraine King, CNP, Addie Lass, CNP, Tinnie Acre, PA-C, Aimee Miester, CNP, Shirlene Pouch, CNP,  Mathilda Meager, CNP, Devere Ingle, CNP, Oliva Magnuson, PA-C, [place], PA-C, Romero Dam, CNP, Yancy Mulch, CNP, Jeoffrey Ada, CNP,  Emmie Dutton, CNS, Edsel Server, CNP, Rock Sink, CNP         [facility]   IN-PATIENT SERVICE   [facility]    Progress Note    02/13/2024    8:07 AM    Name:   Katherine Mcintosh  MRN:     [id number]     Acct:      [id number]   Room:   [id number]  IP Day:  6  Admit Date:  02/07/2024  8:18 PM    PCP:   Jerilynn Slough, APRN - CNP  Code Status:  Full Code    Subjective:     C/C: Acute pyelonephritis    Interval History Status: improved.   Hemodynamically stable.  Denies any current complaints.  Labs reviewed.    Brief History:     This is a 58 year old with a significant past medical history of bipolar disorder, polysubstance abuse on methadone  who initially presented to [facility] with chief complaint of right flank pain and found to have a right sided renal abscess.    Review of Systems:     Constitutional: positive for fatigue. negative for chills,  fevers, sweats  Respiratory:  positive for shortness of breath due to flank pain with deep inhalation. negative for cough, wheezing  Cardiovascular:  negative for chest pain, chest pressure/discomfort, lower extremity edema, palpitations  Gastrointestinal: positive for abdominal pain, constipation. negative for diarrhea, nausea, vomiting.   Neurological:  negative for dizziness, headache    Medications:     Allergies:    Allergies   Allergen Reactions    Penicillins Hives       Current Meds:   Scheduled Meds:    polyethylene glycol  17 g Oral BID    cefTRIAXone  (ROCEPHIN ) IV  1,000 mg IntraVENous Q24H    senna  1 tablet Oral BID    pantoprazole   20 mg Oral QAM AC    methadone   105 mg Oral Daily    vancomycin  (VANCOCIN ) intermittent dosing (placeholder)   Other RX Placeholder    vancomycin   1,250 mg IntraVENous Q12H    sodium chloride  flush  5-40 mL IntraVENous 2 times per day    enoxaparin   40 mg SubCUTAneous Daily     Continuous Infusions:    sodium chloride  75 mL/hr at 02/12/24 2328    sodium chloride        PRN Meds: oxyCODONE -acetaminophen , docusate sodium , melatonin, sodium chloride  flush, sodium chloride , ondansetron  **OR** ondansetron , acetaminophen  **OR** acetaminophen     Data:  Past Medical History:   has a past medical history of Bipolar 1 disorder (HCC).    Social History:   reports that she has been smoking cigarettes. She has been exposed to tobacco smoke. She does not have any smokeless tobacco history on file. She reports current drug use. Drug: Cocaine.     Family History: No family history on file.    Vitals:  BP 118/82   Pulse 60   Temp 98.4 F (36.9 C) (Oral)   Resp 18   Ht 1.651 m (5' 5)   Wt 69 kg (152 lb 1.9 oz)   SpO2 97%   BMI 25.31 kg/m   Temp (24hrs), Avg:98.4 F (36.9 C), Min:98.4 F (36.9 C), Max:98.4 F (36.9 C)    No results for input(s): POCGLU in the last 72 hours.    I/O (24Hr):    Intake/Output Summary (Last 24 hours) at 02/13/2024 0807  Last data filed at 02/13/2024  0615  Gross per 24 hour   Intake 2232.25 ml   Output --   Net 2232.25 ml       Labs:  Hematology:  Recent Labs     02/10/24  1923 02/11/24  0526 02/11/24  1002 02/12/24  1523 02/13/24  0631   WBC 10.7 10.7  --  9.1 7.4   RBC 3.43* 3.55*  --  3.37* 3.63*   HGB 10.3* 10.6*  --  10.3* 10.9*   HCT 30.5* 32.1*  --  31.2* 35.0*   MCV 88.9 90.4  --  92.6 96.4   MCH 30.0 29.9  --  30.6 30.0   MCHC 33.8 33.0  --  33.0 31.1   RDW 12.7 12.9  --  12.9 13.0   PLT 348 320  --  321 329   MPV 11.0 10.7  --  11.1 10.7   CRP 97.1*  --   --  85.8*  --    INR  --   --  1.1  --   --      Chemistry:  Recent Labs     02/10/24  1923 02/11/24  0526 02/12/24  1523 02/13/24  0631   NA 136 136 137 135*   K 3.7 4.0 4.5 3.9   CL 99 101 100 100   CO2 27 26 29 22    GLUCOSE 143* 97 101* 122*   BUN 4* 3* 4* 3*   CREATININE 0.6 0.5* 0.5* 0.5*   MG 1.9 1.8 2.0  --    ANIONGAP 10 9 8* 13   LABGLOM >90 >90 >90 >90   CALCIUM 8.7 8.4* 8.5* 8.5*     No results for input(s): LABALBU, LABA1C, T3TOTAL, FT4, TSH, AST, ALT, LDH, GGT, ALKPHOS, BILITOT, BILIDIR, AMMONIA, AMYLASE, LIPASE, LACTATE, CHOL, HDL, CHOLHDLRATIO, TRIG, VLDL, HIV12AB, PHENYTOIN, PHENYF, URICACID, POCGLU in the last 72 hours.    Invalid input(s): PROT, T4TOTAL, LABGGT, LDLCHOLESTEROL    ABG:No results found for: POCPH, PHART, PH, POCPCO2, PCO2ART, PCO2, POCPO2, PO2ART, PO2, POCHCO3, HCO3ART, HCO3, NBEA, PBEA, BEART, BE, THGBART, THB, TCO2ART, POCO2SAT, O2SATART, O2SAT, FIO2  Lab Results   Component Value Date/Time    SPECIAL LFT ARM 02/08/2024 06:12 PM     Lab Results   Component Value Date/Time    CULTURE NO GROWTH 4 DAYS 02/08/2024 06:12 PM     Radiology:  CT ABDOMEN PELVIS W IV CONTRAST Additional Contrast? None  Result Date: 02/09/2024  1. Right renal cortical lesion with associated enhancing thickened rind measuring approximately 2.4 x  2.1 cm and adjacent perinephric hemorrhage  or inflammatory change, concerning for renal abscess versus neoplasm. No right-sided hydronephrosis or renal calculus. Further evaluation with MRI may be helpful. 2. No left renal calculus, hydronephrosis, or renal lesion. 3. Moderate to large volume fecal residuals in the colon.     Physical Examination:        General appearance:  alert, cooperative and no distress  Mental Status:  oriented to person, place and time and normal affect  Lungs:  clear to auscultation bilaterally, normal effort  Heart:  regular rate and rhythm, no murmur  Abdomen:  soft and nondistended but tender in the RUQ. Bowel sounds hypoactive.   Extremities:  no edema, redness, tenderness in the calves  Skin:  no gross lesions, rashes, induration    Assessment:        Hospital Problems           Last Modified POA    * (Principal) Acute pyelonephritis 02/07/2024 Yes    Acute stress reaction 02/08/2024 Yes    Renal abscess 02/08/2024 Yes     Plan:        Renal abscess.  Infectious disease and urology following. IR was consulted and suggested that there was no drainable collection.  Continued on IV antibiotics vancomycin  while inpatient and switch to linezolid  600 mg p.o. twice daily as treatment for MRSA renal abscess for 4 weeks..  Plan to reimage as outpatient in 6 to 8 weeks per urology..      Bipolar disorder.  Continue supportive care.    History of drug abuse.  Continue methadone  105 mg daily.  Gylcolax and Senokot for constipation.    DVT prophylaxis: Lovenox   GI prophylaxis: Protonix     Discharge planning:plan to d/c home once cleared by ID and urology     Talvin Christianson Willy, MD  02/13/2024  8:07 AM

## 2024-02-17 ENCOUNTER — Inpatient Hospital Stay
Admission: RE | Admit: 2024-02-17 | Discharge: 2024-02-19 | Disposition: A | Discharge status: Home / Self Care | Payer: Medicaid (Managed Care) | Source: Other Acute Inpatient Hospital

## 2024-02-17 DIAGNOSIS — N151 Renal and perinephric abscess: Principal | ICD-10-CM

## 2024-02-17 MED ORDER — LINEZOLID 600 MG PO TABS
600 | Freq: Two times a day (BID) | ORAL | Status: DC
Start: 2024-02-17 — End: 2024-02-17
  Administered 2024-02-18: 01:00:00 600 mg via ORAL

## 2024-02-17 MED ORDER — NORMAL SALINE FLUSH 0.9 % IV SOLN
0.9 | INTRAVENOUS | Status: DC | PRN
Start: 2024-02-17 — End: 2024-02-19

## 2024-02-17 MED ORDER — SODIUM CHLORIDE 0.9 % IV SOLN
0.9 | INTRAVENOUS | Status: DC | PRN
Start: 2024-02-17 — End: 2024-02-19

## 2024-02-17 MED ORDER — ONDANSETRON HCL 4 MG/2ML IJ SOLN
4 | Freq: Four times a day (QID) | INTRAMUSCULAR | Status: DC | PRN
Start: 2024-02-17 — End: 2024-02-19
  Administered 2024-02-18 (×2): 4 mg via INTRAVENOUS

## 2024-02-17 MED ORDER — ENOXAPARIN SODIUM 40 MG/0.4ML IJ SOSY
40 | Freq: Every day | INTRAMUSCULAR | Status: DC
Start: 2024-02-17 — End: 2024-02-19
  Administered 2024-02-18 – 2024-02-19 (×2): 40 mg via SUBCUTANEOUS

## 2024-02-17 MED ORDER — SODIUM CHLORIDE 0.9 % IV SOLN
0.9 | INTRAVENOUS | Status: DC
Start: 2024-02-17 — End: 2024-02-19
  Administered 2024-02-18: 01:00:00 via INTRAVENOUS

## 2024-02-17 MED ORDER — ACETAMINOPHEN 325 MG PO TABS
325 | Freq: Four times a day (QID) | ORAL | Status: DC | PRN
Start: 2024-02-17 — End: 2024-02-19
  Administered 2024-02-19: 06:00:00 650 mg via ORAL

## 2024-02-17 MED ORDER — NORMAL SALINE FLUSH 0.9 % IV SOLN
0.9 | Freq: Two times a day (BID) | INTRAVENOUS | Status: DC
Start: 2024-02-17 — End: 2024-02-19
  Administered 2024-02-18 – 2024-02-19 (×4): 10 mL via INTRAVENOUS

## 2024-02-17 MED ORDER — ACETAMINOPHEN 650 MG RE SUPP
650 | Freq: Four times a day (QID) | RECTAL | Status: DC | PRN
Start: 2024-02-17 — End: 2024-02-19

## 2024-02-17 MED ORDER — ONDANSETRON 4 MG PO TBDP
4 | Freq: Three times a day (TID) | ORAL | Status: DC | PRN
Start: 2024-02-17 — End: 2024-02-19

## 2024-02-17 MED ORDER — POLYETHYLENE GLYCOL 3350 17 G PO PACK
17 | Freq: Every day | ORAL | Status: DC | PRN
Start: 2024-02-17 — End: 2024-02-19

## 2024-02-17 NOTE — H&P (Signed)
 [facility]  Office: [phone]  Marty Fitz, DO, Carlin Gula, DO, Taft Dust, DO, Reyes Blood, DO, Annalee Oppenheim, MD, Renard Sago, MD, Vergia Meter, MD, Rico Gill, MD,  Penne Shams, MD, Loyal Bathe, MD, Zainulabedin Waqar, MD,  Emery Clam, DO, Ozell Fitz, DO, Lucienne Cordial, MD,  Mabel Kemps, DO, Shelda Medico, MD, Inocente Burner, MD, Dilnoor Willy, MD,  Kalyan Jagarlamudi, MD, Korene Muller, MD, Caridad Coca, MD, Rosi Odea, MD, Margretta Kays, MD, Consepcion Heady, MD, Toribio Hole, DO, Robbi Buss, MD, Vincenza Fairly, DO, Wynell Lash, MD, Ronal Lennox, MD, Emery Blossom, MD, Mohsin Blossom, MD, Damian Bjork, MD, Orlean Bart, CNP,  Santana Gavel, CNP, Toribio Cooks, CNP,  Mliss Nicely, DNP, Charlene Pang, CNP, Lolita Herald, CNP, Lauraine King, CNP, Addie Lass, CNP, Tinnie Acre, PA-C, Kyriana Yankee, CNP, Shirlene Pouch, CNP,  Mathilda Meager, CNP, Devere Ingle, CNP, Oliva Magnuson, PA-C, [place], PA-C, Romero Dam, CNP, Yancy Mulch, CNP, Jeoffrey Ada, CNP,  Emmie Dutton, CNS, Edsel Server, CNP, Rock Sink, CNP         [facility]   IN-PATIENT SERVICE   [facility]    HISTORY AND PHYSICAL EXAMINATION            Date:   02/17/2024  Patient name:  Katherine Mcintosh  Date of admission:  No admission date for patient encounter.  MRN:   [id number]  Account:  [id number]  Date of Birth:  1966/02/16  PCP:    Jerilynn Slough, APRN - CNP  Room:   Room/bed info not found  Code Status:    Full code     Chief Complaint:     Increasing flank pain     History Obtained From:     Patient & medical records     History of Present Illness:     NATALIJA MAVIS is a 58 y.o. Unavailable / unknown female pmh Iv drug abuse, bipolar disorder, copd, h/o hep C and Tobacco abuse who presents in transfer from [facility] with complaints of increasing right flank pain.    and is admitted to the hospital for the management of right renal abscess.      Patient presented to [facility] due to right-sided flank pain on 02/06/24.  CT abdomen showed 2.5 cm fluid collection within the parenchyma of right kidney consistent with right-sided renal abscess.  The patient was transferred to [facility] for further management and need for drain placement by IR.   Initial labs showed urinalysis negative for WBC. Patient denied any IV drug use, however her urine tox screen was positive for cocaine, THC, opiates. Her urine culture from the [facility] is growing Staph aureus MRSA, no blood cultures obtained. ID and urology following. IR was consulted and suggested that there was no drainable collection.  Continued on IV antibiotics vancomycin  while inpatient and switch to linezolid  600 mg p.o. twice daily as treatment for MRSA renal abscess for 4 weeks..  Plan to reimage as outpatient in 6 to 8 weeks per urology.    However, she presented back to [facility] ER yesterday reporting worsening right sided flank pain, chills and subjective fever. Pain worse with inspiration. She reports she has continued her ABX upon d/c as prescribed.     Repeat CT of ab pelvis with iv contrast obtained in ER, 2.8 cm hypodense peripherally enhancing right renal lesion w/ extensive adjacent inflammatory changes  c/f abscess   +bladder wall thickening c/f cystitis     Blood pressure ROLLEN)  146/92, pulse 58, temperature 98.2 F (36.8 C), temperature source Oral, resp. rate 15, height 1.651 m (5' 5), weight 67.9 kg (149 lb 11.1 oz), SpO2 95%.     Past Medical History:     Past Medical History:   Diagnosis Date    Bipolar 1 disorder (HCC)         Past Surgical History:     No past surgical history on file.     Medications Prior to Admission:     Prior to Admission medications   Medication Sig Start Date End Date Taking? Authorizing Provider   docusate sodium  (COLACE, DULCOLAX) 100 MG CAPS Take 100 mg by mouth 2 times daily as needed for Constipation 02/13/24   Patti, Dilnoor, MD   senna  (SENOKOT) 8.6 MG tablet Take 1 tablet by mouth 2 times daily 02/13/24 03/14/24  Willy Dilnoor, MD   pantoprazole  (PROTONIX ) 20 MG tablet Take 1 tablet by mouth every morning (before breakfast) 02/14/24   Willy, Dilnoor, MD   linezolid  (ZYVOX ) 600 MG tablet Take 1 tablet by mouth 2 times daily for 28 days 02/13/24 03/12/24  Willy Dilnoor, MD   methadone  (DOLOPHINE ) 10 MG/ML solution Take 10.5 mLs by mouth daily for 7 days. Max Daily Amount: 105 mg 02/14/24 02/21/24  Patti, Dilnoor, MD   ondansetron  (ZOFRAN -ODT) 4 MG disintegrating tablet Take 1 tablet by mouth every 8 hours as needed for Nausea or Vomiting 02/13/24   Willy, Dilnoor, MD   risperiDONE (RISPERDAL PO) Take by mouth Unknown dose and frequency per pt, pt not taking    [provider]   OXcarbazepine (TRILEPTAL PO) Take by mouth Unknown dose and frequency, pt not taking    [provider]        Allergies:     Penicillins    Social History:     Tobacco:    reports that she has been smoking cigarettes. She has been exposed to tobacco smoke. She does not have any smokeless tobacco history on file.  Alcohol:      has no history on file for alcohol use.  Drug Use:  reports current drug use. Drug: Cocaine.    Family History:     No family history on file.    Review of Systems:     Positive and Negative as described in HPI.    Review of Systems   Genitourinary:  Positive for flank pain.       Physical Exam:   There were no vitals taken for this visit.  No data recorded.    No results for input(s): POCGLU in the last 72 hours.  No intake or output data in the 24 hours ending 02/17/24 1409    Physical Exam  Constitutional:       General: She is not in acute distress.     Appearance: Normal appearance. She is obese. She is not toxic-appearing.   HENT:      Mouth/Throat:      Comments: Poor dentition   Eyes:      Conjunctiva/sclera: Conjunctivae normal.   Cardiovascular:      Rate and Rhythm: Normal rate and regular rhythm.      Pulses: Normal pulses.    Pulmonary:      Effort: Respiratory distress present.   Abdominal:      General: Abdomen is flat. Bowel sounds are normal.      Palpations: Abdomen is soft.      Tenderness: There is right CVA tenderness.  Musculoskeletal:         General: Normal range of motion.      Cervical back: Normal range of motion.   Skin:     General: Skin is warm.   Neurological:      General: No focal deficit present.      Mental Status: She is alert and oriented to person, place, and time.   Psychiatric:         Mood and Affect: Mood normal.         Investigations:      Laboratory Testing:  No results found for this or any previous visit (from the past 24 hours).    Imaging/Diagnostics:  CT ABDOMEN PELVIS W IV CONTRAST Additional Contrast? Radiologist Recommendation  Result Date: 02/10/2024  1. Redemonstration of peripherally enhancing cystic lesion in the right interpolar region with marked associated inflammatory stranding, overall measuring approximately 3.3 x 2.5 cm, similar to the prior examination. Findings are concerning for renal abscess. 2. Mild circumferential bladder wall thickening. Correlate with urinalysis.       Assessment :      Hospital Problems           Last Modified POA    Acute pyelonephritis 02/17/2024 Yes    Renal abscess 02/17/2024 Yes    Bipolar II disorder (HCC) 02/17/2024 Yes       Plan:     Patient status inpatient in the Med/Surge    Renal abscess recent acute pyelonephritis-IR evaluated patient on prior admission.  Renal abscess too small to drain at that time.  Patient is continue to have increasing flank pain.  Presented back to [facility]. Will resume Zxyox Iv bid  Consult Urology for reevaluation   2. Npo after midnight in the event surgical intervention is planned    History of IV drug use-continues on methadone   Resume prior dose of metadone  Uds +cocaine, THC and opiates on prior ER evaluation     Bipolar disorder   1. Noted , not on medication     Consultations:   IR  ID  Urology        Patient  is admitted as inpatient status because of co-morbidities listed above, severity of signs and symptoms as outlined, requirement for current medical therapies and most importantly because of direct risk to patient if care not provided in a hospital setting.  Expected length of stay > 48 hours.    Toya Powers, APRN - NP  02/17/2024  2:09 PM    Copy sent to Dr. Jerilynn Slough, APRN - CNP

## 2024-02-18 LAB — CBC WITH AUTO DIFFERENTIAL
Basophils %: 1 % (ref 0–2)
Basophils Absolute: 0.08 k/uL (ref 0.00–0.20)
Eosinophils %: 4 % (ref 1–4)
Eosinophils Absolute: 0.23 k/uL (ref 0.00–0.44)
Hematocrit: 35.7 % — ABNORMAL LOW (ref 36.3–47.1)
Hemoglobin: 11.3 g/dL — ABNORMAL LOW (ref 11.9–15.1)
Immature Granulocytes %: 0 %
Immature Granulocytes Absolute: <0.03 k/uL (ref 0.00–0.30)
Lymphocytes %: 47 % — ABNORMAL HIGH (ref 24–43)
Lymphocytes Absolute: 2.85 k/uL (ref 1.10–3.70)
MCH: 28.7 pg (ref 25.2–33.5)
MCHC: 31.7 g/dL (ref 28.4–34.8)
MCV: 90.6 fL (ref 82.6–102.9)
MPV: 10.6 fL (ref 8.1–13.5)
Monocytes %: 9 % (ref 3–12)
Monocytes Absolute: 0.53 k/uL (ref 0.10–1.20)
NRBC Automated: 0 /100{WBCs}
Neutrophils %: 39 % (ref 36–65)
Neutrophils Absolute: 2.41 k/uL (ref 1.50–8.10)
Platelets: 521 k/uL — ABNORMAL HIGH (ref 138–453)
RBC: 3.94 m/uL — ABNORMAL LOW (ref 3.95–5.11)
RDW: 12.7 % (ref 11.8–14.4)
WBC: 6.1 k/uL (ref 3.5–11.3)

## 2024-02-18 LAB — BASIC METABOLIC PANEL W/ REFLEX TO MG FOR LOW K
Anion Gap: 8 mmol/L — ABNORMAL LOW (ref 9–16)
BUN: 5 mg/dL — ABNORMAL LOW (ref 6–20)
CO2: 27 mmol/L (ref 20–31)
Calcium: 9.3 mg/dL (ref 8.6–10.4)
Chloride: 103 mmol/L (ref 98–107)
Creatinine: 0.6 mg/dL (ref 0.6–0.9)
Est, Glom Filt Rate: >90 mL/min/1.73m2 (ref >60)
Glucose: 86 mg/dL (ref 74–99)
Potassium: 4.5 mmol/L (ref 3.7–5.3)
Sodium: 138 mmol/L (ref 136–145)

## 2024-02-18 LAB — PROTIME-INR
INR: 1.1
Protime: 14.3 s (ref 11.7–14.9)

## 2024-02-18 LAB — APTT: APTT: 36.3 s (ref 23.0–36.5)

## 2024-02-18 MED ORDER — MICONAZOLE NITRATE 2 % EX POWD
2 | Freq: Two times a day (BID) | CUTANEOUS | Status: DC
Start: 2024-02-18 — End: 2024-02-19
  Administered 2024-02-19 (×2): via TOPICAL

## 2024-02-18 MED ORDER — PANTOPRAZOLE SODIUM 20 MG PO TBEC
20 | Freq: Every day | ORAL | Status: DC
Start: 2024-02-18 — End: 2024-02-19
  Administered 2024-02-19: 12:00:00 20 mg via ORAL

## 2024-02-18 MED ORDER — LINEZOLID 600 MG/300ML IV SOLN
600 | Freq: Two times a day (BID) | INTRAVENOUS | Status: DC
Start: 2024-02-18 — End: 2024-02-19
  Administered 2024-02-18 – 2024-02-19 (×4): 600 mg via INTRAVENOUS

## 2024-02-18 MED ORDER — METHADONE HCL 10 MG/ML PO CONC
10 | Freq: Every day | ORAL | Status: DC
Start: 2024-02-18 — End: 2024-02-19
  Administered 2024-02-18 – 2024-02-19 (×2): 105 mg via ORAL

## 2024-02-18 MED ORDER — SENNOSIDES 8.6 MG PO TABS
8.6 | Freq: Every evening | ORAL | Status: DC
Start: 2024-02-18 — End: 2024-02-19
  Administered 2024-02-19: 01:00:00 8.6 via ORAL

## 2024-02-18 MED ORDER — MELATONIN 3 MG PO TABS
3 | Freq: Every evening | ORAL | Status: DC | PRN
Start: 2024-02-18 — End: 2024-02-19
  Administered 2024-02-18: 02:00:00 3 mg via ORAL

## 2024-02-18 MED ORDER — OXYCODONE-ACETAMINOPHEN 5-325 MG PO TABS
5-325 | ORAL | Status: DC | PRN
Start: 2024-02-18 — End: 2024-02-19
  Administered 2024-02-18 – 2024-02-19 (×5): 1 via ORAL

## 2024-02-18 MED ORDER — HYDROMORPHONE HCL 1 MG/ML IJ SOLN
1 | Freq: Once | INTRAMUSCULAR | Status: AC
Start: 2024-02-18 — End: 2024-02-18
  Administered 2024-02-18: 13:00:00 0.25 mg via INTRAVENOUS

## 2024-02-18 MED ORDER — DOCUSATE SODIUM 100 MG PO CAPS
100 | Freq: Every day | ORAL | Status: DC
Start: 2024-02-18 — End: 2024-02-19
  Administered 2024-02-19: 12:00:00 100 mg via ORAL

## 2024-02-18 MED FILL — DILAUDID 1 MG/ML IJ SOLN: 1 mg/mL | INTRAMUSCULAR | Qty: 0.5 | Fill #0

## 2024-02-18 MED FILL — PANTOPRAZOLE SODIUM 20 MG PO TBEC: 20 mg | ORAL | Qty: 1 | Fill #0

## 2024-02-18 MED FILL — SENNA-LAX 8.6 MG PO TABS: 8.6 mg | ORAL | Qty: 1 | Fill #0

## 2024-02-18 MED FILL — ONDANSETRON HCL 4 MG/2ML IJ SOLN: 4 MG/2ML | INTRAMUSCULAR | Qty: 2 | Fill #0

## 2024-02-18 MED FILL — LINEZOLID 600 MG/300ML IV SOLN: 600 MG/300ML | INTRAVENOUS | Qty: 300 | Fill #0

## 2024-02-18 MED FILL — MELATONIN 3 MG PO TABS: 3 mg | ORAL | Qty: 1 | Fill #0

## 2024-02-18 MED FILL — ZEASORB-AF 2 % EX POWD: 2 % | CUTANEOUS | Qty: 71 | Fill #0

## 2024-02-18 MED FILL — OXYCODONE-ACETAMINOPHEN 5-325 MG PO TABS: 5-325 mg | ORAL | Qty: 1 | Fill #0

## 2024-02-18 MED FILL — ENOXAPARIN SODIUM 40 MG/0.4ML IJ SOSY: 40 MG/0.4ML | INTRAMUSCULAR | Qty: 0.4 | Fill #0

## 2024-02-18 MED FILL — LINEZOLID 600 MG PO TABS: 600 mg | ORAL | Qty: 1 | Fill #0

## 2024-02-18 MED FILL — METHADONE HCL 10 MG/ML PO CONC: 10 mg/mL | ORAL | Qty: 15 | Fill #0

## 2024-02-18 NOTE — Plan of Care (Signed)
 Problem: Pain  Goal: Verbalizes/displays adequate comfort level or baseline comfort level  Outcome: Progressing

## 2024-02-18 NOTE — Progress Notes (Signed)
 @INTERMEDLOGO @    [facility]   IN-PATIENT SERVICE   [facility]    Progress Note    02/18/2024    2:04 PM    Name:   Katherine Mcintosh  MRN:     [id number]     Acct:      [id number]   Room:   [id number]  IP Day:  1  Admit Date:  02/17/2024  7:40 PM    PCP:   No primary care provider on file.  Code Status:  Full Code    Subjective:     Patient evaluated at bedside, she is still complaining of right sided abdominal pain.  Denies any shortness of breath or chest pain.  No acute overnight events.    Brief History:     Per HPI:  Patient presented to [facility] due to right-sided flank pain on 02/06/24.  CT abdomen showed 2.5 cm fluid collection within the parenchyma of right kidney consistent with right-sided renal abscess.  The patient was transferred to [facility] for further management and need for drain placement by IR.   Initial labs showed urinalysis negative for WBC. Patient denied any IV drug use, however her urine tox screen was positive for cocaine, THC, opiates. Her urine culture from the [facility] is growing Staph aureus MRSA, no blood cultures obtained. ID and urology following. IR was consulted and suggested that there was no drainable collection.  Continued on IV antibiotics vancomycin  while inpatient and switch to linezolid  600 mg p.o. twice daily as treatment for MRSA renal abscess for 4 weeks..  Plan to reimage as outpatient in 6 to 8 weeks per urology.     However, she presented back to [facility] ER yesterday reporting worsening right sided flank pain, chills and subjective fever. Pain worse with inspiration. She reports she has continued her ABX upon d/c as prescribed.      Repeat CT of ab pelvis with iv contrast obtained in ER, 2.8 cm hypodense peripherally enhancing right renal lesion w/ extensive adjacent inflammatory changes  c/f abscess   +bladder wall thickening c/f cystitis      Blood pressure (!) 146/92, pulse 58, temperature 98.2 F (36.8 C),  temperature source Oral, resp. rate 15, height 1.651 m (5' 5), weight 67.9 kg (149 lb 11.1 oz), SpO2 95%.     Medications:     Allergies:    Allergies   Allergen Reactions    Penicillins Hives       Current Meds:   Scheduled Meds:    sodium chloride  flush  5-40 mL IntraVENous 2 times per day    enoxaparin   40 mg SubCUTAneous Daily    linezolid   600 mg IntraVENous Q12H    methadone   105 mg Oral Daily    pantoprazole   20 mg Oral QAM AC    docusate sodium   100 mg Oral Daily    senna  1 tablet Oral Nightly     Continuous Infusions:    sodium chloride  100 mL/hr at 02/18/24 0048    sodium chloride        PRN Meds: sodium chloride  flush, sodium chloride , ondansetron  **OR** ondansetron , acetaminophen  **OR** acetaminophen , polyethylene glycol, oxyCODONE -acetaminophen , melatonin    Data:     Past Medical History:   has a past medical history of Bipolar 1 disorder (HCC).    Social History:   reports that she has been smoking cigarettes. She has been exposed to tobacco smoke. She does not have any  smokeless tobacco history on file. She reports current drug use. Drug: Cocaine.     Family History: No family history on file.    Vitals:  BP (!) 156/83   Pulse 52   Temp (!) 96.7 F (35.9 C) (Oral)   Resp 18   Ht 1.651 m (5' 5)   Wt 67.9 kg (149 lb 11.1 oz)   SpO2 96%   BMI 24.91 kg/m   Temp (24hrs), Avg:97.5 F (36.4 C), Min:96.7 F (35.9 C), Max:98.2 F (36.8 C)    No results for input(s): POCGLU in the last 72 hours.    I/O (24Hr):    Intake/Output Summary (Last 24 hours) at 02/18/2024 1404  Last data filed at 02/18/2024 0048  Gross per 24 hour   Intake 802.39 ml   Output --   Net 802.39 ml       Labs:  Hematology:  Recent Labs     02/18/24  0803   WBC 6.1   RBC 3.94*   HGB 11.3*   HCT 35.7*   MCV 90.6   MCH 28.7   MCHC 31.7   RDW 12.7   PLT 521*   MPV 10.6   INR 1.1     Chemistry:  Recent Labs     02/18/24  0803   NA 138   K 4.5   CL 103   CO2 27   GLUCOSE 86   BUN 5*   CREATININE 0.6   ANIONGAP 8*   LABGLOM >90    CALCIUM 9.3   No results for input(s): LABALBU, LABA1C, T3TOTAL, FT4, TSH, AST, ALT, LDH, GGT, ALKPHOS, BILITOT, BILIDIR, AMMONIA, AMYLASE, LIPASE, LACTATE, CHOL, HDL, CHOLHDLRATIO, TRIG, VLDL, HIV12AB, PHENYTOIN, PHENYF, URICACID, POCGLU in the last 72 hours.    Invalid input(s): PROT, T4TOTAL, LABGGT, LDLCHOLESTEROL  ABG:No results found for: POCPH, PHART, PH, POCPCO2, PCO2ART, PCO2, POCPO2, PO2ART, PO2, POCHCO3, HCO3ART, HCO3, NBEA, PBEA, BEART, BE, THGBART, THB, TCO2ART, POCO2SAT, O2SATART, O2SAT, FIO2  Lab Results   Component Value Date/Time    SPECIAL LFT ARM 02/08/2024 06:12 PM     Lab Results   Component Value Date/Time    CULTURE NO GROWTH 5 DAYS 02/08/2024 06:12 PM       Radiology:  No results found.    Physical Examination:        Physical Exam  HENT:      Head: Normocephalic.   Cardiovascular:      Rate and Rhythm: Normal rate.      Heart sounds: No murmur heard.     No friction rub. No gallop.   Pulmonary:      Effort: No respiratory distress.      Breath sounds: No wheezing, rhonchi or rales.   Abdominal:      Tenderness: There is abdominal tenderness.   Musculoskeletal:         General: No swelling.   Skin:     General: Skin is warm.   Neurological:      Mental Status: She is alert and oriented to person, place, and time.   Psychiatric:         Mood and Affect: Mood normal.         Assessment:        Hospital Problems           Last Modified POA    * (Principal) Renal abscess 02/17/2024 Yes    Acute pyelonephritis 02/17/2024 Yes    Bipolar II disorder (HCC) 02/17/2024 Yes  Opioid type dependence, abuse (HCC) 02/17/2024 Yes    Drug abuse (HCC) 02/17/2024 Yes       Plan:        Right renal abscess  History of MRSA  Renal abscess is too small to drain per IR  Urology following, appreciate recommendations  Continue IV linezolid   Infectious disease consulted, await recommendations    History of IV  drug use  UDS positive for cocaine, THC, opiates  Continue methadone     Bipolar disorder  Not on any medications    VTE prophylaxis with Lovenox     Essam Lowdermilk E Marjan Magdelene Ruark, DO  02/18/2024  2:04 PM

## 2024-02-18 NOTE — Care Coordination-Inpatient (Signed)
 Case Management Assessment  Initial Evaluation    Date/Time of Evaluation: 02/18/2024 11:05 AM  Assessment Completed by: Erminio Bonine, RN    If patient is discharged prior to next notation, then this note serves as note for discharge by case management.    Patient Name: Katherine Mcintosh                   Date of Birth: [birth year]  Diagnosis: Renal abscess [N15.1]                   Date / Time: 02/17/2024  7:40 PM    Patient Admission Status: Inpatient   Readmission Risk (Low < 19, Mod (19-27), High > 27): Readmission Risk Score: 8.7    Current PCP: No primary care provider on file.  PCP verified by CM? (P) No PCP    Chart Reviewed: Yes      History Provided by: (P) Patient  Patient Orientation: (P) Alert, Oriented, Person, Place, Situation    Patient Cognition: (P) No Apparent Deficit    Hospitalization in the last 30 days (Readmission):  Yes    If yes, Readmission Assessment in CM Navigator will be completed.    Advance Directives:      Code Status: Full Code   Patient's Primary Decision Maker is: (P) Legal Next of Kin      Discharge Planning:    Patient lives with: (P) Children Type of Home: (P) Apartment (2nd floor [address])  Primary Care Giver: (P) Self  Patient Support Systems include: (P) Children, Family Members   Current Financial resources: (P) Medicaid  Current community resources: (P) None  Current services prior to admission: (P) None            Current DME:  none            Type of Home Care services:  (P) None    ADLS  Prior functional level: (P) Independent in ADLs/IADLs  Current functional level: (P) Independent in ADLs/IADLs    PT AM-PAC:   /24  OT AM-PAC:   /24    Family can provide assistance at DC: (P) No  Would you like Case Management to discuss the discharge plan with any other family members/significant others, and if so, who? (P) No  Plans to Return to Present Housing: (P) Yes  Other Identified Issues/Barriers to RETURNING to current housing: none  Potential Assistance needed at discharge: (P)  N/A            Potential DME:  none  Patient expects to discharge to: (P) Home  Plan for transportation at discharge: (P) Family    Financial    Payor: [facility] COMMUNITY HEALTH PLAN / Plan: [facility] COMMUNITY HEALTH PLAN / Product Type: *No Product type* /     Does insurance require precert for SNF: Yes    Potential assistance Purchasing Medications: (P) No  Meds-to-Beds request: Yes      [id number] - [place], [place] - [address] - P [phone] [address] [phone]  [address]  [place] [address]  Phone: [phone] Fax: [phone]      Notes:    Factors facilitating achievement of predicted outcomes: Family support, Cooperative, and Pleasant    Barriers to discharge: Pain, Limited family support, and Stairs at home    Additional Case Management Notes: Transitional planning: Plan is to return home with family. Daughter will transport. Address, emergency contacts, PCP and insurance confirmed with patient. Patient denies any  needs.     The Plan for Transition of Care is related to the following treatment goals of Renal abscess [N15.1]    IF APPLICABLE: The Patient and/or patient representative Cedrica and her family were provided with a choice of provider and agrees with the discharge plan. Freedom of choice list with basic dialogue that supports the patient's individualized plan of care/goals and shares the quality data associated with the providers was provided to: (P) Patient   Patient Representative Name:       The Patient and/or Patient Representative Agree with the Discharge Plan? (P) Yes    Erminio Bonine, RN  Case Management Department  Ph: [phone] Fax: [phone]

## 2024-02-18 NOTE — Consults (Signed)
 [facility]  [name]. Chrys Landgrebe, MD FACS    Consult    Patient:  Katherine Mcintosh  MRN: [id number]  Date of birth: 1966-01-31    CHIEF COMPLAINT: Right renal abscess    HISTORY OF PRESENT ILLNESS:   The patient is a 58 y.o. female who presented on 9/13 with significant right flank pain to [facility], found to have a 2.5 cm renal abscess in the right kidney.  Of note, denies history of positive urine tox screen with cocaine, THC, opiates, denies IV drug use, also has a history of hepatitis C.  She did have a positive urine culture for MRSA, there was high suspicion of hematogenous spread.  She was reimaged multiple times and this showed a slight but modest increase in renal abscess, IR was consulted and they felt there was no drainable collection.  She was subsequently discharged with antibiotics which she has been taking.  She presented back to outside hospital yesterday with increased pain and subjective fever and chills.  A repeat CT scan did show a 2.8 cm hypodense peripherally enhancing right renal lesion with extensive inflammatory changes as well as bladder wall thickening.  On evaluation, she does report a lot of flank pain as well as dysuria but is afebrile, vital stable.  Her labs are mostly unremarkable with no leukocytosis, creatinine 0.5.  Her urine shows mild pyuria and mild microscopic hematuria.  She is currently on IV linezolid .    Patient's old records, notes and chart reviewed and summarized above.     Past Medical History:    Past Medical History:   Diagnosis Date    Bipolar 1 disorder (HCC)        Past Surgical History:    No past surgical history on file.  Previous Urologic Surgery: none  Medications Prior to Admission:    Prior to Admission medications   Medication Sig Start Date End Date Taking? Authorizing Provider   docusate sodium  (COLACE, DULCOLAX) 100 MG CAPS Take 100 mg by mouth 2 times daily as needed for Constipation 02/13/24   Patti, Dilnoor, MD   senna (SENOKOT)  8.6 MG tablet Take 1 tablet by mouth 2 times daily 02/13/24 03/14/24  Willy Dilnoor, MD   pantoprazole  (PROTONIX ) 20 MG tablet Take 1 tablet by mouth every morning (before breakfast) 02/14/24   Willy, Dilnoor, MD   linezolid  (ZYVOX ) 600 MG tablet Take 1 tablet by mouth 2 times daily for 28 days 02/13/24 03/12/24  Willy Dilnoor, MD   methadone  (DOLOPHINE ) 10 MG/ML solution Take 10.5 mLs by mouth daily for 7 days. Max Daily Amount: 105 mg 02/14/24 02/21/24  Patti, Dilnoor, MD   ondansetron  (ZOFRAN -ODT) 4 MG disintegrating tablet Take 1 tablet by mouth every 8 hours as needed for Nausea or Vomiting 02/13/24   Willy, Dilnoor, MD   risperiDONE (RISPERDAL PO) Take by mouth Unknown dose and frequency per pt, pt not taking    [provider]   OXcarbazepine (TRILEPTAL PO) Take by mouth Unknown dose and frequency, pt not taking    [provider]       Allergies:  Penicillins    Social History:    Social History     Socioeconomic History    Marital status: Unknown     Spouse name: Not on file    Number of children: Not on file    Years of education: Not on file    Highest education level: Not on file   Occupational History  Not on file   Tobacco Use    Smoking status: Every Day     Current packs/day: 0.50     Types: Cigarettes     Passive exposure: Current    Smokeless tobacco: Not on file    Tobacco comments:     Current smoker: 0.5 pack/day x 25 yrs per pt   Substance and Sexual Activity    Alcohol use: Not on file    Drug use: Yes     Types: Cocaine     Comment: pt states using cocaine 3 days ago    Sexual activity: Not on file   Other Topics Concern    Not on file   Social History Narrative    Not on file     Social Drivers of Health     Financial Resource Strain: Medium Risk (09/30/2022)    Received from [facility]    Overall Financial Resource Strain (CARDIA)     Difficulty of Paying Living Expenses: Somewhat hard   Food Insecurity: No Food Insecurity (02/17/2024)    Hunger Vital Sign      Worried About Running Out of Food in the Last Year: Never true     Ran Out of Food in the Last Year: Never true   Transportation Needs: No Transportation Needs (02/17/2024)    PRAPARE - Therapist, art (Medical): No     Lack of Transportation (Non-Medical): No   Physical Activity: Sufficiently Active (11/19/2017)    Received from [facility]    Exercise Vital Sign     Days of Exercise per Week: 6 days     Minutes of Exercise per Session: 60 min   Stress: Stress Concern Present (11/19/2017)    Received from [facility]    [employer] of Occupational Health - Occupational Stress Questionnaire     Feeling of Stress : Rather much   Social Connections: Moderately Integrated (11/19/2017)    Received from [facility]    Social Connection and Isolation Panel     Frequency of Communication with Friends and Family: More than three times a week     Frequency of Social Gatherings with Friends and Family: Three times a week     Attends Religious Services: More than 4 times per year     Active Member of Clubs or Organizations: Yes     Attends Banker Meetings: More than 4 times per year     Marital Status: Divorced   Intimate Partner Violence: Not At Risk (08/07/2022)    Received from [employer] [facility] [employer], Afraid, Rape, and Kick questionnaire     Within the last year, have you been afraid of your partner or ex-partner?: No     Within the last year, have you been humiliated or emotionally abused in other ways by your partner or ex-partner?: No     Within the last year, have you been kicked, hit, slapped, or otherwise physically hurt by your partner or ex-partner?: No     Within the last year, have you been raped or forced to have any kind of sexual activity by your partner or ex-partner?: No   Housing Stability: Low Risk  (02/17/2024)    Housing Stability Vital Sign     Unable to Pay for Housing in the Last Year: No     Number of Times  Moved in the Last Year: 0     Homeless  in the Last Year: No       Family History:  No family history on file.    REVIEW OF SYSTEMS:  Constitutional: negative  Eyes: negative  Respiratory: negative  Cardiovascular: negative}  Gastrointestinal: negative  Genitourinary: see HPI  Musculoskeletal: negative  Skin: negative  Neurological: negative  Hematological/Lymphatic: negative  Psychological: negative    Physical Exam:      Patient Vitals for the past 24 hrs:   BP Temp Temp src Pulse Resp SpO2 Height Weight   02/18/24 0315 -- -- -- -- 16 -- -- --   02/17/24 2109 -- -- -- -- 15 -- -- --   02/17/24 1942 (!) 146/92 98.2 F (36.8 C) Oral 58 15 95 % 1.651 m (5' 5) 67.9 kg (149 lb 11.1 oz)     Constitutional: Patient in no acute distress.  Neuro: alert and oriented to person place and time.  HEENT: No acute abnormalities  Lungs: Respiratory effort normal on room air  Cardiovascular:  Heart rate regular rate and rhythm  Abdomen: Soft, non-tender, non-distended. Non peritonitic  Extremities: No leg swelling, no edema  GU: Moderate right flank pain      LABS:   No results for input(s): WBC, HGB, HCT, MCV, PLT in the last 72 hours.  No results for input(s): NA, K, CL, CO2, PHOS, BUN, CREATININE in the last 72 hours.    Invalid input(s): CA  No results found for: PSA    Additional Lab/culture results:  Urine culture MRSA from outside hospital    Urinalysis: No results for input(s): COLORU, PHUR, LABCAST, WBCUA, RBCUA, MUCUS, TRICHOMONAS, YEAST, BACTERIA, CLARITYU, SPECGRAV, LEUKOCYTESUR, UROBILINOGEN, BILIRUBINUR, BLOODU in the last 72 hours.    Invalid input(s): NITRATE, GLUCOSEUKETONESUAMORPHOUS     -----------------------------------------------------------------  Imaging Results:  CT scan showing 2.8 cm right renal abscess from outside hospital.    Assessment and Plan   Impression:  58 year old female with right renal abscess measuring 2.8 cm, did attempt  conservative management with antibiotics, continues to have pain and subjective fevers, no leukocytosis.  Will get images pushed to PACS system and speak to IR about potentially aspirating versus placing a drain.  Otherwise, her antibiotic should be managed by ID.    Plan:  - Keep n.p.o., will speak to IR about potentially placing drain in right renal abscess  - INR this morning  - Continue linezolid , might need ID consult due to history of MRSA  - If patient develops fevers or significant leukocytosis, this would be a sign that she needs more urgent drain placement    Alm Cool, MD  Urology Resident, PGY-5    I have reviewed the history above and agree.   I have reviewed all laboratory findings and imaging reports/films.  I agree with the plan as noted above.    Electronically signed by Cordella JONETTA Hidden, MD on 02/19/2024 at 4:28 PM

## 2024-02-18 NOTE — Plan of Care (Signed)
 Problem: Pain  Goal: Verbalizes/displays adequate comfort level or baseline comfort level  02/18/2024 1826 by Blondie Herring, RN  Outcome: Progressing  02/18/2024 0434 by Lorel Lin, RN  Outcome: Progressing

## 2024-02-18 NOTE — Care Coordination-Inpatient (Signed)
 02/18/24 1105   Readmission Assessment   Who is being Interviewed Patient   Number of Days since last admission? 1-7 days   Previous Disposition Home with Family   What was the patient's/caregiver's perception as to why they think they needed to return back to the hospital? Worsening of symptoms/Unexpected complications   Did you visit your Primary Care Physician after you left the hospital, before you returned this time? No   Why weren't you able to visit your PCP? Other (Comment)  (does not have PCP currently)   Did you see a specialist, such as Cardiac, Pulmonary, Orthopedic Physician, etc. after you left the hospital? No   Who advised the patient to return to the hospital? Self-referral   Does the patient report anything that got in the way of taking their medications? No   What could we have done to help prevent your return to the hospital? No opportunities identified by patient   Have You Received a Follow Up Phone Call from a HealthCare Provider After Discharge? No

## 2024-02-18 NOTE — Consults (Signed)
 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  Bipolar disorder  Allergy to Penicillin: Hives    Recommendations:   Currently on linezolid , started on 02/17/24  Urology recommends IR place drain in right renal abscess    During prior hospitalization 02/13/24  Received ceftriaxone  and vancomycin   Patient D/C'd on linezolid  600 mg po BID for treatment of MRSA renal abscess x 4 weeks  Drainage:  IR states abscess too small to drain  Urology to follow up in clinic    Medical Decision Making/Summary/Discussion:02/18/2024       Infection Control Recommendations   Universal Precautions    Antimicrobial Stewardship Recommendations     Simplification of therapy  Targeted therapy    Coordination of Outpatient Care:   Estimated Length of IV antimicrobials:TBD  Patient will need Midline Catheter Insertion: TBD  Patient will need PICC line Insertion:TBD  Patient will need: Home IV , Infusion Center,  SNF,  LTAC: TBD  Patient will need outpatient wound care: No    Chief complaint/reason for consultation:   Renal abscess, history of IV drug use, history of TB treatment       History of Present Illness:   Katherine Mcintosh is a 58 y.o.-year-old female who was initially admitted on 02/17/2024. Patient seen at the request of Dr. Maree    INITIAL HISTORY:     This patient has a past medical history of:  Drug abuse, on methadone  program  Bipolar disorder  Hepatitis C     Patient presented to [facility] due to right-sided flank pain on 02/06/24.  CT abdomen showed 2.5 cm fluid collection within the parenchyma of right kidney consistent with right-sided renal abscess.  The patient was transferred to [facility] for further management and need for drain placement by IR.  Initial labs showed urinalysis negative for WBC.  Patient denied any IV drug use, however her urine tox  screen was positive for cocaine, THC, opiates.  Her urine culture from the [facility] is growing Staph aureus MRSA, no blood cultures obtained.    While inpatient, IR was consulted and they indicated abscess was too small to drain.  Patient continued on IV antibiotics while inpatient then switch to oral for treatment of the abscess for 4 weeks, with plan to do repeat imaging of the abscess in 6 to 8 weeks.    However, she presented back to [facility] ER 02-15-22 due to worsening right sided flank pain, chills, and self-reported fever. Patient also reported increased pain with inspiration.  A repeat CT of abdomen/pelvis with  contrast obtained in ED and it showed, a 2.8 cm hypodense peripherally enhancing right renal lesion with extensive adjacent inflammatory changes consistent with abscess, as well as thickening of the bladder wall consistent with current cystitis.    ID was consulted for evaluation and antibiotic recommendations/management.    CURRENT EVALUATION- DAILY INTERVAL CHANGES 02/18/2024  BP (!) 156/83   Pulse 52   Temp (!) 96.7 F (35.9 C) (Oral)   Resp 18   Ht 1.651 m (5' 5)   Wt 67.9 kg (149 lb 11.1 oz)   SpO2 96%   BMI 24.91 kg/m     Afebrile  Vital signs stable, HTN    Patient readmitted because of exacerbation of rt flank pain  Patient had CT on  02-17-24 at [facility] which showed 2.8 cm lesion vs prior 3.3 x 2.5 cm on CT at [facility] on 02-10-24.    CT disk sent from [facility], will have it uploaded to our system to review    Urology suggested IR aspirate    ID wise on Linezolid       Labs, X rays reviewed: 02/18/2024 with independent review of X rays    I have independently reviewed/ordered the following labs:    CBC with Differential:   Recent Labs     02/18/24  0803   WBC 6.1   HGB 11.3*   HCT 35.7*   PLT 521*   LYMPHOPCT 47*   MONOPCT 9   EOSPCT 4     BMP:   Recent Labs     02/18/24  0803   NA 138   K 4.5   CL 103   CO2 27   BUN 5*   CREATININE 0.6     Hepatic Function Panel: No results  for input(s): LABALBU, BILIDIR, IBILI, BILITOT, ALKPHOS, ALT, AST in the last 72 hours.    Invalid input(s): PROT  No results for input(s): RPR in the last 72 hours.  No results for input(s): HIV in the last 72 hours.  No results for input(s): BC in the last 72 hours.  Lab Results   Component Value Date/Time    BACTERIA None 02/08/2024 12:29 PM    RBC 3.94 02/18/2024 08:03 AM    WBC 6.1 02/18/2024 08:03 AM    TURBIDITY Clear 02/08/2024 12:29 PM     Lab Results   Component Value Date/Time    CREATININE 0.6 02/18/2024 08:03 AM    GLUCOSE 86 02/18/2024 08:03 AM         Cultures:  Urine:   02-06-24: From [place]: S aureus  02-08-24: No growth   Blood:   02-08-24: No growth   Sputum :     Wound:     MRSA Nares:  02-08-24: Positive     Imaging:     02-09-2024              Review of Systems:     Pertinent review of symptoms listed in Initial Evaluation and daily interval evaluations sections      Physical Examination :   Patient Vitals for the past 8 hrs:   BP Temp Temp src Pulse Resp SpO2   02/18/24 0817 (!) 156/83 (!) 96.7 F (35.9 C) Oral 52 18 96 %     General Appearance: Awake, alert, and in no apparent distress  Head:  Normocephalic, no trauma  Eyes: Pupils equal, round, reactive to light and accommodation; extraocular movements intact; sclera anicteric; conjunctivae pink. No embolic phenomena.  ENT: Oropharynx clear, without erythema, exudate, or thrush. No tenderness of sinuses.   Mouth/throat: mucosa pink and moist. No lesions.  Poor dentition.  Neck:Supple, without lymphadenopathy. Thyroid normal, No bruits.  Pulmonary/Chest: Clear to auscultation, without wheezes, rales, or rhonchi.  No dullness to percussion.   Cardiovascular: Regular rate and rhythm without murmurs, rubs, or gallops.   Abdomen: Soft, tender on the right CVA  All four Extremities: No cyanosis, clubbing, edema, or effusions.  Neurologic: No gross sensory or motor deficits.  Skin: Warm and dry with good turgor.No signs of  peripheral arterial or venous insufficiency. No ulcerations. No open wounds.      I have personally reviewed the past medical history, past surgical history, medications, social history, and family history, and I have updated the  database accordingly.    Past Medical History:     Past Medical History:   Diagnosis Date    Bipolar 1 disorder (HCC)        Past Surgical  History:   No past surgical history on file.    Medications:      sodium chloride  flush  5-40 mL IntraVENous 2 times per day    enoxaparin   40 mg SubCUTAneous Daily    linezolid   600 mg IntraVENous Q12H    methadone   105 mg Oral Daily    pantoprazole   20 mg Oral QAM AC    docusate sodium   100 mg Oral Daily    senna  1 tablet Oral Nightly       Social History:     Social History     Socioeconomic History    Marital status: Unknown     Spouse name: Not on file    Number of children: Not on file    Years of education: Not on file    Highest education level: Not on file   Occupational History    Not on file   Tobacco Use    Smoking status: Every Day     Current packs/day: 0.50     Types: Cigarettes     Passive exposure: Current    Smokeless tobacco: Not on file    Tobacco comments:     Current smoker: 0.5 pack/day x 25 yrs per pt   Substance and Sexual Activity    Alcohol use: Not on file    Drug use: Yes     Types: Cocaine     Comment: pt states using cocaine 3 days ago    Sexual activity: Not on file   Other Topics Concern    Not on file   Social History Narrative    Not on file     Social Drivers of Health     Financial Resource Strain: Medium Risk (09/30/2022)    Received from [facility]    Overall Financial Resource Strain (CARDIA)     Difficulty of Paying Living Expenses: Somewhat hard   Food Insecurity: No Food Insecurity (02/17/2024)    Hunger Vital Sign     Worried About Running Out of Food in the Last Year: Never true     Ran Out of Food in the Last Year: Never true   Transportation Needs: No Transportation Needs (02/17/2024)    PRAPARE -  Therapist, art (Medical): No     Lack of Transportation (Non-Medical): No   Physical Activity: Sufficiently Active (11/19/2017)    Received from [facility]    Exercise Vital Sign     Days of Exercise per Week: 6 days     Minutes of Exercise per Session: 60 min   Stress: Stress Concern Present (11/19/2017)    Received from [facility]    [employer] of Occupational Health - Occupational Stress Questionnaire     Feeling of Stress : Rather much   Social Connections: Moderately Integrated (11/19/2017)    Received from [facility]    Social Connection and Isolation Panel     Frequency of Communication with Friends and Family: More than three times a week     Frequency of Social Gatherings with Friends and Family: Three times a week     Attends Religious Services: More than 4 times per year     Active Member of Clubs or Organizations: Yes  Attends Banker Meetings: More than 4 times per year     Marital Status: Divorced   Intimate Partner Violence: Not At Risk (08/07/2022)    Received from [employer] [facility] [employer], Afraid, Rape, and Kick questionnaire     Within the last year, have you been afraid of your partner or ex-partner?: No     Within the last year, have you been humiliated or emotionally abused in other ways by your partner or ex-partner?: No     Within the last year, have you been kicked, hit, slapped, or otherwise physically hurt by your partner or ex-partner?: No     Within the last year, have you been raped or forced to have any kind of sexual activity by your partner or ex-partner?: No   Housing Stability: Low Risk  (02/17/2024)    Housing Stability Vital Sign     Unable to Pay for Housing in the Last Year: No     Number of Times Moved in the Last Year: 0     Homeless in the Last Year: No       Family History:   No family history on file.     Allergies:   Penicillins       Medical Decision Making-Imaging:   No  results found.    Medical Decision Making-Cultures-Other:     Results       Procedure Component Value Units Date/Time    Culture, Anaerobic and Aerobic [[id number]] Collected: 02/11/24 0730    Order Status: Canceled Specimen: Other     MRSA DNA Probe, Nasal [[id number]]  (Abnormal) Collected: 02/08/24 1834    Order Status: Completed Specimen: Nasal swab Updated: 02/09/24 0927     Specimen Description .NASAL SWAB     MRSA, DNA, Nasal POSITIVE:  MRSA DNA detected by nucleic acid amplification.     Comment:                                                   Results should be used as an adjunct to nosocomial control efforts to identify patients   needing enhanced precautions.    The test is not intended to identify patients with staphylococcal infections.  Results   should not be used to guide or monitor treatment for MRSA infections.         Culture, Blood 2 [[id number]] Collected: 02/08/24 1812    Order Status: Completed Specimen: Blood Updated: 02/13/24 1843     Specimen Description .BLOOD     Special Requests LFT ARM     Culture NO GROWTH 5 DAYS    Culture, Blood 1 [[id number]] Collected: 02/08/24 1807    Order Status: Completed Specimen: Blood Updated: 02/13/24 1843     Specimen Description .BLOOD     Special Requests RT ARM     Culture NO GROWTH 5 DAYS    Culture, Urine [[id number]] Collected: 02/08/24 1709    Order Status: Completed Specimen: Urine, Clean Catch Updated: 02/09/24 1212     Specimen Description .CLEAN CATCH URINE     Special Requests Site: Urine     Culture NO GROWTH    Culture, Blood 1 [[id number]] Collected: 02/08/24 1500    Order Status: Canceled Specimen: Blood     C.trachomatis N.gonorrhoeae DNA, Urine [[id number]] Collected: 02/08/24 1229    Order  Status: Completed Specimen: Urine Updated: 02/09/24 1014     Specimen Description .URINE     C. trachomatis DNA ,Urine NEGATIVE     Comment: CHLAMYDIA TRACHOMATIS DNA not detected by nucleic acid amplification.        This test is intended  for medical purposes only and is not valid for the evaluation of   suspected sexual abuse or for other forensic purposes.  In certain contexts, culture may be required to meet applicable laws and regulations for   diagnosis of C. trachomatis and N. gonorrhoeae infections.  Per 2014  CDC recommendations, this test does not include confirmation of positive results   by an alternative nucleic acid target.          N. gonorrhoeae DNA, Urine NEGATIVE     Comment: NEISSERIA GONORRHOEAE DNA not detected by nucleic acid amplification.        This test is intended for medical purposes only and is not valid for the evaluation of   suspected sexual abuse or for other forensic purposes.  In certain contexts, culture may be required to meet applicable laws and regulations for   diagnosis of C. trachomatis and N. gonorrhoeae infections.  Per 2014  CDC recommendations, this test does not include confirmation of positive results   by an alternative nucleic acid target.                   Medical Decision Making-Other:     Note:      Thank you for allowing us  to participate in the care of this patient. Please call with questions.    Tita Caprice Fox, DPM PGY-1    ATTESTATION:    I have discussed the case, including pertinent history and exam findings with the medical resident or medical student. I have evaluated the  History, physical findings and pictures of the patient and the key elements of the encounter have been performed by me. I have reviewed the laboratory data, other diagnostic studies and discussed them with the medical resident or student. I have updated the medical record where necessary.    I agree with the assessment, plan and orders as documented by the medical resident or student and I have modified them as necessary.     Sheree Brim, MD    02/18/2024     Pager: [phone] - Office: [phone]

## 2024-02-19 LAB — CBC WITH AUTO DIFFERENTIAL
Basophils %: 1 % (ref 0–2)
Basophils Absolute: 0.07 k/uL (ref 0.00–0.20)
Eosinophils %: 5 % — ABNORMAL HIGH (ref 1–4)
Eosinophils Absolute: 0.26 k/uL (ref 0.00–0.44)
Hematocrit: 34 % — ABNORMAL LOW (ref 36.3–47.1)
Hemoglobin: 11 g/dL — ABNORMAL LOW (ref 11.9–15.1)
Immature Granulocytes %: 0 %
Immature Granulocytes Absolute: <0.03 k/uL (ref 0.00–0.30)
Lymphocytes %: 45 % — ABNORMAL HIGH (ref 24–43)
Lymphocytes Absolute: 2.21 k/uL (ref 1.10–3.70)
MCH: 29.7 pg (ref 25.2–33.5)
MCHC: 32.4 g/dL (ref 28.4–34.8)
MCV: 91.9 fL (ref 82.6–102.9)
MPV: 10.1 fL (ref 8.1–13.5)
Monocytes %: 9 % (ref 3–12)
Monocytes Absolute: 0.45 k/uL (ref 0.10–1.20)
NRBC Automated: 0 /100{WBCs}
Neutrophils %: 40 % (ref 36–65)
Neutrophils Absolute: 2 k/uL (ref 1.50–8.10)
Platelets: 483 k/uL — ABNORMAL HIGH (ref 138–453)
RBC: 3.7 m/uL — ABNORMAL LOW (ref 3.95–5.11)
RDW: 12.8 % (ref 11.8–14.4)
WBC: 5 k/uL (ref 3.5–11.3)

## 2024-02-19 LAB — MAGNESIUM: Magnesium: 1.9 mg/dL (ref 1.6–2.6)

## 2024-02-19 LAB — BASIC METABOLIC PANEL
Anion Gap: 10 mmol/L (ref 9–16)
BUN: 5 mg/dL — ABNORMAL LOW (ref 6–20)
CO2: 24 mmol/L (ref 20–31)
Calcium: 9 mg/dL (ref 8.6–10.4)
Chloride: 102 mmol/L (ref 98–107)
Creatinine: 0.6 mg/dL (ref 0.6–0.9)
Est, Glom Filt Rate: >90 mL/min/1.73m2 (ref >60)
Glucose: 110 mg/dL — ABNORMAL HIGH (ref 74–99)
Potassium: 4.4 mmol/L (ref 3.7–5.3)
Sodium: 136 mmol/L (ref 136–145)

## 2024-02-19 LAB — PHOSPHORUS: Phosphorus: 3.4 mg/dL (ref 2.5–4.5)

## 2024-02-19 MED FILL — PANTOPRAZOLE SODIUM 20 MG PO TBEC: 20 mg | ORAL | Qty: 1 | Fill #0

## 2024-02-19 MED FILL — OXYCODONE-ACETAMINOPHEN 5-325 MG PO TABS: 5-325 mg | ORAL | Qty: 1 | Fill #0

## 2024-02-19 MED FILL — LINEZOLID 600 MG/300ML IV SOLN: 600 MG/300ML | INTRAVENOUS | Qty: 300 | Fill #0

## 2024-02-19 MED FILL — ACETAMINOPHEN 325 MG PO TABS: 325 mg | ORAL | Qty: 2 | Fill #0

## 2024-02-19 MED FILL — ENOXAPARIN SODIUM 40 MG/0.4ML IJ SOSY: 40 MG/0.4ML | INTRAMUSCULAR | Qty: 0.4 | Fill #0

## 2024-02-19 MED FILL — METHADONE HCL 10 MG/ML PO CONC: 10 mg/mL | ORAL | Qty: 15 | Fill #0

## 2024-02-19 MED FILL — SENNA-LAX 8.6 MG PO TABS: 8.6 mg | ORAL | Qty: 1 | Fill #0

## 2024-02-19 MED FILL — DOCUSATE SODIUM 100 MG PO CAPS: 100 mg | ORAL | Qty: 1 | Fill #0

## 2024-02-19 NOTE — Discharge Instructions (Addendum)
 Continue taking medications and antibiotic as prescribed  Follow-up with infectious disease and urology outpatient  Follow-up with a PCP outpatient  Return to the ED for worsening of any symptoms

## 2024-02-19 NOTE — Plan of Care (Signed)
 Problem: Pain  Goal: Verbalizes/displays adequate comfort level or baseline comfort level  02/19/2024 0500 by Alveria Peal, RN  Outcome: Progressing  Flowsheets (Taken 02/19/2024 0500)  Verbalizes/displays adequate comfort level or baseline comfort level:   Encourage patient to monitor pain and request assistance   Assess pain using appropriate pain scale   Administer analgesics based on type and severity of pain and evaluate response   Consider cultural and social influences on pain and pain management   Implement non-pharmacological measures as appropriate and evaluate response  02/18/2024 1826 by Blondie Herring, RN  Outcome: Progressing

## 2024-02-19 NOTE — Discharge Summary (Signed)
 @INTERMEDLOGO @    [facility]   IN-PATIENT SERVICE   [facility]    Discharge Summary     Patient ID: Katherine Mcintosh  DOB:  31-Oct-1965   MRN: [id number]     ACCOUNT:  [id number]   Patient's PCP: No primary care provider on file.  Admit Date: 02/17/2024   Discharge Date: 02/19/2024     Length of Stay: 2  Code Status:  Full Code  Admitting Physician: No admitting provider for patient encounter.  Discharge Physician: Vincenza FORBES Lenward Maree, DO     Active Discharge Diagnoses:     Hospital Problem Lists:  Principal Problem:    Renal abscess  Active Problems:    Acute pyelonephritis    Bipolar II disorder (HCC)    Opioid type dependence, abuse (HCC)    Drug abuse (HCC)  Resolved Problems:    * No resolved hospital problems. *      Discharged Condition: stable    Hospital Stay:     Hospital Course:  Katherine Mcintosh is a 58 y.o. female who was admitted for the management of   Renal abscess , presented to ER with No chief complaint on file.    This is a 58 year old female who presented to [facility] with complaints of worsening right-sided flank pain. CT abdomen showed 2.5 cm fluid collection within the parenchyma of right kidney consistent with right-sided renal abscess. The patient was transferred to [facility] for further management and need for drain placement by IR.  Patient has a recent history of hospitalization for the renal abscess earlier this month and was discharged on 02/13/2024.  She was on linezolid  outpatient per ID recommendations.      Significant therapeutic interventions:     Right renal abscess  History of MRSA  Renal abscess is too small to drain per IR  Patient was evaluated by ID, recommend continuing on the linezolid  and following up outpatient.  Plan is for continuing on linezolid  600 mg p.o. twice daily for total of 4 weeks and to reimage as outpatient.  Patient was also evaluated by urology, they have cleared patient for discharge     History of IV drug use  UDS  positive for cocaine, THC, opiates  Continue methadone      Bipolar disorder  Not on any medications    Significant Diagnostic Studies:   Labs / Micro:  CBC:   Lab Results   Component Value Date/Time    WBC 5.0 02/19/2024 12:27 PM    RBC 3.70 02/19/2024 12:27 PM    HGB 11.0 02/19/2024 12:27 PM    HCT 34.0 02/19/2024 12:27 PM    MCV 91.9 02/19/2024 12:27 PM    MCH 29.7 02/19/2024 12:27 PM    MCHC 32.4 02/19/2024 12:27 PM    RDW 12.8 02/19/2024 12:27 PM    PLT 483 02/19/2024 12:27 PM     BMP:    Lab Results   Component Value Date/Time    GLUCOSE 86 02/18/2024 08:03 AM    NA 138 02/18/2024 08:03 AM    K 4.5 02/18/2024 08:03 AM    CL 103 02/18/2024 08:03 AM    CO2 27 02/18/2024 08:03 AM    ANIONGAP 8 02/18/2024 08:03 AM    BUN 5 02/18/2024 08:03 AM    CREATININE 0.6 02/18/2024 08:03 AM    CALCIUM 9.3 02/18/2024 08:03 AM    LABGLOM >90 02/18/2024 08:03 AM  Radiology:  No results found.    Consultations:    Consults:     Final Specialist Recommendations/Findings:   IP CONSULT TO UROLOGY  IP CONSULT TO INFECTIOUS DISEASES      The patient was seen and examined on day of discharge and this discharge summary is in conjunction with any daily progress note from day of discharge.    Discharge plan:     Disposition: Home    Physician Follow Up:   Patient will be provided with information for PCPs upon discharge    Requiring Further Evaluation/Follow Up POST HOSPITALIZATION/Incidental Findings: As above    Diet: regular diet    Activity: As tolerated    Instructions to Patient:   Continue taking medications and antibiotic as prescribed  Follow-up with infectious disease and urology outpatient  Follow-up with a PCP outpatient  Return to the ED for worsening of any symptoms    Discharge Medications:      Medication List        ASK your doctor about these medications      docusate 100 MG Caps  Commonly known as: COLACE, DULCOLAX  Take 100 mg by mouth 2 times daily as needed for Constipation     linezolid  600 MG tablet  Commonly  known as: Zyvox   Take 1 tablet by mouth 2 times daily for 28 days     methadone  10 MG/ML solution  Commonly known as: DOLOPHINE   Take 10.5 mLs by mouth daily for 7 days. Max Daily Amount: 105 mg     ondansetron  4 MG disintegrating tablet  Commonly known as: ZOFRAN -ODT  Take 1 tablet by mouth every 8 hours as needed for Nausea or Vomiting     pantoprazole  20 MG tablet  Commonly known as: PROTONIX   Take 1 tablet by mouth every morning (before breakfast)     RISPERDAL PO     senna 8.6 MG tablet  Commonly known as: SENOKOT  Take 1 tablet by mouth 2 times daily     TRILEPTAL PO              No discharge procedures on file.    Time Spent on discharge is  40 mins in patient examination, evaluation, counseling as well as medication reconciliation, prescriptions for required medications, discharge plan and follow up.    Electronically signed by   Vincenza FORBES Lenward Maree, DO  02/19/2024  1:10 PM      Thank you Dr. Rexford primary care provider on file. for the opportunity to be involved in this patient's care.

## 2024-02-19 NOTE — Progress Notes (Signed)
 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 Please call [phone] for appointment   Urology recommends IR place drain in right renal abscess  IR indicates abscess is too small to reach and is improving by CT    During prior hospitalization 02/13/24  Received ceftriaxone  and vancomycin   Patient D/C'd on linezolid  600 mg po BID for treatment of MRSA renal abscess x 4 weeks  Drainage:  IR states abscess too small to drain  Urology to follow up in clinic    Medical Decision Making/Summary/Discussion:02/19/2024       Infection Control Recommendations   Universal Precautions    Antimicrobial Stewardship Recommendations     Simplification of therapy  Targeted therapy    Coordination of Outpatient Care:   Estimated Length of IV antimicrobials:TBD  Patient will need Midline Catheter Insertion: TBD  Patient will need PICC line Insertion:TBD  Patient will need: Home IV , Infusion Center,  SNF,  LTAC: TBD  Patient will need outpatient wound care: No    Chief complaint/reason for consultation:   Renal abscess, history of IV drug use, history of TB treatment       History of Present Illness:   Katherine Mcintosh is a 58 y.o.-year-old female who was initially admitted on 02/17/2024. Patient seen at the request of Dr. Maree    INITIAL HISTORY:     This patient has a past medical history of:  Drug abuse, on methadone  program  Bipolar disorder  Hepatitis C     Patient presented to [facility] due to right-sided flank pain on 02/06/24.  CT abdomen showed 2.5 cm fluid collection within the parenchyma of right kidney consistent with right-sided renal abscess.  The patient was  transferred to [facility] for further management and need for drain placement by IR.  Initial labs showed urinalysis negative for WBC.  Patient denied any IV drug use, however her urine tox screen was positive for cocaine, THC, opiates.  Her urine culture from the [facility] is growing Staph aureus MRSA, no blood cultures obtained.    While inpatient, IR was consulted and they indicated abscess was too small to drain.  Patient continued on IV antibiotics while inpatient then switch to oral for treatment of the abscess for 4 weeks, with plan to do repeat imaging of the abscess in 6 to 8 weeks.    However, she presented back to [facility] ER 02-15-22 due to worsening right sided flank pain, chills, and self-reported fever. Patient also reported increased pain with inspiration.  A repeat CT of abdomen/pelvis with  contrast obtained in ED and it showed, a 2.8 cm hypodense peripherally enhancing right renal lesion with extensive adjacent inflammatory changes consistent with abscess, as well as thickening of the bladder wall consistent with current cystitis.    ID was consulted for evaluation and antibiotic recommendations/management.    CURRENT EVALUATION- DAILY INTERVAL CHANGES 02/19/2024  BP (!) 142/81   Pulse 52   Temp 97.5 F (36.4 C) (Oral)   Resp 16   Ht 1.651 m (5' 5)   Wt 67.9 kg (149 lb 11.1 oz)  SpO2 97%   BMI 24.91 kg/m     Afebrile  Vital signs stable, HTN    Patient readmitted because of exacerbation of rt flank pain  Patient had CT on 02-17-24 at [facility] which showed 2.8 cm lesion vs prior 3.3 x 2.5 cm on CT at [facility] on 02-10-24.    Patient was resting comfortably in bed during examination.   She denies any nausea, vomiting, fever or chills, but does endorse some abdominal and right sided flank pain.     Urology suggested IR aspirate  IR indicates abscess is too small to reach and is improving by CT      Medications reviewed:   Currently on linezolid , started on 02/17/24  Plans to continue  x 4 weeks and monitor progression      Labs, X rays reviewed: 02/19/2024 with independent review of X rays    I have independently reviewed/ordered the following labs:    CBC with Differential:   Recent Labs     02/18/24  0803   WBC 6.1   HGB 11.3*   HCT 35.7*   PLT 521*   LYMPHOPCT 47*   MONOPCT 9   EOSPCT 4     BMP:   Recent Labs     02/18/24  0803   NA 138   K 4.5   CL 103   CO2 27   BUN 5*   CREATININE 0.6     Hepatic Function Panel: No results for input(s): LABALBU, BILIDIR, IBILI, BILITOT, ALKPHOS, ALT, AST in the last 72 hours.    Invalid input(s): PROT  No results for input(s): RPR in the last 72 hours.  No results for input(s): HIV in the last 72 hours.  No results for input(s): BC in the last 72 hours.  Lab Results   Component Value Date/Time    BACTERIA None 02/08/2024 12:29 PM    RBC 3.94 02/18/2024 08:03 AM    WBC 6.1 02/18/2024 08:03 AM    TURBIDITY Clear 02/08/2024 12:29 PM     Lab Results   Component Value Date/Time    CREATININE 0.6 02/18/2024 08:03 AM    GLUCOSE 86 02/18/2024 08:03 AM         Cultures:  Urine:   02-06-24: From [facility]: S aureus  02-08-24: No growth   Blood:   02-08-24: No growth   Sputum :     Wound:     MRSA Nares:  02-08-24: Positive     Imaging:     02-09-2024              Review of Systems:     Pertinent review of symptoms listed in Initial Evaluation and daily interval evaluations sections      Physical Examination :   Patient Vitals for the past 8 hrs:   BP Temp Temp src Pulse Resp SpO2   02/19/24 0744 (!) 142/81 97.5 F (36.4 C) Oral 52 16 97 %     General Appearance: Awake, alert, and in no apparent distress  Head:  Normocephalic, no trauma  Eyes: Pupils equal, round, reactive to light and accommodation; extraocular movements intact; sclera anicteric; conjunctivae pink. No embolic phenomena.  ENT: Oropharynx clear, without erythema, exudate, or thrush. No tenderness of sinuses.   Mouth/throat: mucosa pink and moist. No lesions.  Poor  dentition.  Neck:Supple, without lymphadenopathy. Thyroid normal, No bruits.  Pulmonary/Chest: Clear to auscultation, without wheezes, rales, or rhonchi.  No dullness to percussion.   Cardiovascular: Regular rate and  rhythm without murmurs, rubs, or gallops.   Abdomen: Soft, tender on the right CVA  All four Extremities: No cyanosis, clubbing, edema, or effusions.  Neurologic: No gross sensory or motor deficits.  Skin: Warm and dry with good turgor.No signs of peripheral arterial or venous insufficiency. No ulcerations. No open wounds.      I have personally reviewed the past medical history, past surgical history, medications, social history, and family history, and I have updated the database accordingly.    Past Medical History:     Past Medical History:   Diagnosis Date    Bipolar 1 disorder (HCC)        Past Surgical  History:   No past surgical history on file.    Medications:      miconazole    Topical BID    sodium chloride  flush  5-40 mL IntraVENous 2 times per day    enoxaparin   40 mg SubCUTAneous Daily    linezolid   600 mg IntraVENous Q12H    methadone   105 mg Oral Daily    pantoprazole   20 mg Oral QAM AC    docusate sodium   100 mg Oral Daily    senna  1 tablet Oral Nightly       Social History:     Social History     Socioeconomic History    Marital status: Unknown     Spouse name: Not on file    Number of children: Not on file    Years of education: Not on file    Highest education level: Not on file   Occupational History    Not on file   Tobacco Use    Smoking status: Every Day     Current packs/day: 0.50     Types: Cigarettes     Passive exposure: Current    Smokeless tobacco: Not on file    Tobacco comments:     Current smoker: 0.5 pack/day x 25 yrs per pt   Substance and Sexual Activity    Alcohol use: Not on file    Drug use: Yes     Types: Cocaine     Comment: pt states using cocaine 3 days ago    Sexual activity: Not on file   Other Topics Concern    Not on file   Social History Narrative    Not  on file     Social Drivers of Health     Financial Resource Strain: Medium Risk (09/30/2022)    Received from [facility]    Overall Financial Resource Strain (CARDIA)     Difficulty of Paying Living Expenses: Somewhat hard   Food Insecurity: No Food Insecurity (02/17/2024)    Hunger Vital Sign     Worried About Running Out of Food in the Last Year: Never true     Ran Out of Food in the Last Year: Never true   Transportation Needs: No Transportation Needs (02/17/2024)    PRAPARE - Therapist, art (Medical): No     Lack of Transportation (Non-Medical): No   Physical Activity: Sufficiently Active (11/19/2017)    Received from [facility]    Exercise Vital Sign     Days of Exercise per Week: 6 days     Minutes of Exercise per Session: 60 min   Stress: Stress Concern Present (11/19/2017)    Received from [facility]    [employer] of Occupational Health - Occupational Stress Questionnaire  Feeling of Stress : Rather much   Social Connections: Moderately Integrated (11/19/2017)    Received from [facility]    Social Connection and Isolation Panel     Frequency of Communication with Friends and Family: More than three times a week     Frequency of Social Gatherings with Friends and Family: Three times a week     Attends Religious Services: More than 4 times per year     Active Member of Clubs or Organizations: Yes     Attends Banker Meetings: More than 4 times per year     Marital Status: Divorced   Intimate Partner Violence: Not At Risk (08/07/2022)    Received from [employer] [facility] [employer], Afraid, Rape, and Kick questionnaire     Within the last year, have you been afraid of your partner or ex-partner?: No     Within the last year, have you been humiliated or emotionally abused in other ways by your partner or ex-partner?: No     Within the last year, have you been kicked, hit, slapped, or otherwise physically hurt  by your partner or ex-partner?: No     Within the last year, have you been raped or forced to have any kind of sexual activity by your partner or ex-partner?: No   Housing Stability: Low Risk  (02/17/2024)    Housing Stability Vital Sign     Unable to Pay for Housing in the Last Year: No     Number of Times Moved in the Last Year: 0     Homeless in the Last Year: No       Family History:   No family history on file.     Allergies:   Penicillins       Medical Decision Making-Imaging:   No results found.    Medical Decision Making-Cultures-Other:     Results       Procedure Component Value Units Date/Time    Culture, Anaerobic and Aerobic [[id number]] Collected: 02/11/24 0730    Order Status: Canceled Specimen: Other     MRSA DNA Probe, Nasal [[id number]]  (Abnormal) Collected: 02/08/24 1834    Order Status: Completed Specimen: Nasal swab Updated: 02/09/24 0927     Specimen Description .NASAL SWAB     MRSA, DNA, Nasal POSITIVE:  MRSA DNA detected by nucleic acid amplification.     Comment:                                                   Results should be used as an adjunct to nosocomial control efforts to identify patients   needing enhanced precautions.    The test is not intended to identify patients with staphylococcal infections.  Results   should not be used to guide or monitor treatment for MRSA infections.         Culture, Blood 2 [[id number]] Collected: 02/08/24 1812    Order Status: Completed Specimen: Blood Updated: 02/13/24 1843     Specimen Description .BLOOD     Special Requests LFT ARM     Culture NO GROWTH 5 DAYS    Culture, Blood 1 [[id number]] Collected: 02/08/24 1807    Order Status: Completed Specimen: Blood Updated: 02/13/24 1843     Specimen Description .BLOOD     Special Requests RT ARM      Culture NO GROWTH 5 DAYS    Culture, Urine [[id number]] Collected: 02/08/24 1709    Order Status: Completed Specimen: Urine, Clean Catch Updated: 02/09/24 1212     Specimen Description .CLEAN CATCH  URINE     Special Requests Site: Urine     Culture NO GROWTH    Culture, Blood 1 [[id number]] Collected: 02/08/24 1500    Order Status: Canceled Specimen: Blood     C.trachomatis N.gonorrhoeae DNA, Urine [[id number]] Collected: 02/08/24 1229    Order Status: Completed Specimen: Urine Updated: 02/09/24 1014     Specimen Description .URINE     C. trachomatis DNA ,Urine NEGATIVE     Comment: CHLAMYDIA TRACHOMATIS DNA not detected by nucleic acid amplification.        This test is intended for medical purposes only and is not valid for the evaluation of   suspected sexual abuse or for other forensic purposes.  In certain contexts, culture may be required to meet applicable laws and regulations for   diagnosis of C. trachomatis and N. gonorrhoeae infections.  Per 2014  CDC recommendations, this test does not include confirmation of positive results   by an alternative nucleic acid target.          N. gonorrhoeae DNA, Urine NEGATIVE     Comment: NEISSERIA GONORRHOEAE DNA not detected by nucleic acid amplification.        This test is intended for medical purposes only and is not valid for the evaluation of   suspected sexual abuse or for other forensic purposes.  In certain contexts, culture may be required to meet applicable laws and regulations for   diagnosis of C. trachomatis and N. gonorrhoeae infections.  Per 2014  CDC recommendations, this test does not include confirmation of positive results   by an alternative nucleic acid target.                   Medical Decision Making-Other:     Note:      Thank you for allowing us  to participate in the care of this patient. Please call with questions.    Tita Caprice Fox, DPM PGY-1    ATTESTATION:    I have discussed the case, including pertinent history and exam findings with the medical resident or medical student. I have evaluated the  History, physical findings and pictures of the patient and the key elements of the encounter have been performed by me. I have  reviewed the laboratory data, other diagnostic studies and discussed them with the medical resident or student. I have updated the medical record where necessary.    I agree with the assessment, plan and orders as documented by the medical resident or student and I have modified them as necessary.     Sheree Brim, MD    02/19/2024     Pager: [phone] - Office: [phone]

## 2024-02-19 NOTE — Plan of Care (Signed)
 Problem: Pain  Goal: Verbalizes/displays adequate comfort level or baseline comfort level  02/19/2024 1337 by Laurine Birmingham, RN  Outcome: Completed  02/19/2024 0500 by Alveria Peal, RN  Outcome: Progressing  Flowsheets (Taken 02/19/2024 0500)  Verbalizes/displays adequate comfort level or baseline comfort level:   Encourage patient to monitor pain and request assistance   Assess pain using appropriate pain scale   Administer analgesics based on type and severity of pain and evaluate response   Consider cultural and social influences on pain and pain management   Implement non-pharmacological measures as appropriate and evaluate response

## 2024-02-19 NOTE — Discharge Summary (Signed)
 Discharge order received. IV removed. Writer went over d/c instructions with pt, including medications and follow up appointments. Pt verbalized understanding. Pt was discharged with all documented belongings. Pt ambulated off unit with steady gait.

## 2024-03-18 ENCOUNTER — Ambulatory Visit: Admit: 2024-03-18 | Discharge: 2024-03-18 | Payer: Medicaid (Managed Care) | Attending: Specialist | Primary: Specialist

## 2024-03-18 VITALS — BP 126/78 | HR 70 | Ht 65.0 in | Wt 149.0 lb

## 2024-03-18 DIAGNOSIS — N151 Renal and perinephric abscess: Principal | ICD-10-CM

## 2024-03-18 LAB — POCT URINALYSIS DIPSTICK
Bilirubin, UA: NEGATIVE
Blood, UA POC: NEGATIVE
Glucose, UA POC: NEGATIVE mg/dL
Ketones, UA: NEGATIVE mg/dL
Leukocytes, UA: NEGATIVE
Nitrite, UA: NEGATIVE
Protein, UA POC: NEGATIVE mg/dL
Spec Grav, UA: 1.02
Urobilinogen, UA: 0.2 mg/dL
pH, UA: 6.5

## 2024-03-18 NOTE — Progress Notes (Signed)
 "  Katherine Mcintosh. [name], MD, [facility]  [facility] New Patient/Consult Office Visit  Patient:  Katherine Mcintosh  Date of Birth: Sep 28, 1965  Date: 03/18/2024    HISTORY OF PRESENT ILLNESS:   The patient is a 58 y.o. female who presents today for evaluation of the following problems:   The patient presents with a Right renal abscess noted on 02/09/24 CT and treated conservatively with 6-8 weeks of antibiotics since IR did not feel it was drainable.  Patient denies any flank pain, nausea, vomiting or gross hematuria.  She completed her course of antibiotics.  Today's UA was negative.  She will need to be re-imaged with a CT abdomen with contrast.  Patient has urgency and frequency due to her excessive coffee, pop, and water  intake.  Patient has nocturia x 3; patient instructed to limit fluid intake 2-3 hours prior to bedtime to minimize nocturia or nocturnal incontinence.      (Patient's old records have been requested, reviewed and summarized in today's note.)    Summary of old records:   Right renal abscess on 02/09/24 CT: linezolid  600 mg po bid x 6 weeks; needs repeat a CT abdomen with contrast  urgency and frequency due to 2 coffee and 1 pop/day  Nocturia x 3: nocturnal fluid restriction     Procedures Today: N/A    Urinalysis today:  Results for orders placed or performed in visit on 03/18/24   POCT Urinalysis no Micro   Result Value Ref Range    Color, UA amber     Clarity, UA clear     Glucose, UA POC negative mg/dL    Bilirubin, UA negative     Ketones, UA negative mg/dL    Spec Grav, UA 8.979     Blood, UA POC negative     pH, UA 6.5     Protein, UA POC negative mg/dL    Urobilinogen, UA 0.2 e.u./dl mg/dL    Leukocytes, UA negative     Nitrite, UA negative        Today's AUA Symptom Score (QOL): 24 (5)    Last BUN and creatinine:  Lab Results   Component Value Date    BUN 5 (L) 02/19/2024     Lab Results   Component Value Date    CREATININE 0.6 02/19/2024       Additional Lab/Culture results: 02/08/24 urine  C&S, BC x2 neg    Imaging Reviewed during this Office Visit:   02/10/24 CT-scan of abdomen and pelvis with contrast  1. Redemonstration of peripherally enhancing cystic lesion in the right  interpolar region with marked associated inflammatory stranding, overall  measuring approximately 3.3 x 2.5 cm, similar to the prior examination.  Findings are concerning for renal abscess.  2. Mild circumferential bladder wall thickening. Correlate with urinalysis.  (results were independently reviewed by physician and radiology report verified)    PAST MEDICAL, FAMILY AND SOCIAL HISTORY:  Past Medical History:   Diagnosis Date    Bipolar 1 disorder (HCC)      No past surgical history on file.  No family history on file.  Current Outpatient Medications   Medication Sig Dispense Refill    docusate sodium  (COLACE, DULCOLAX) 100 MG CAPS Take 100 mg by mouth 2 times daily as needed for Constipation 30 capsule 0    pantoprazole  (PROTONIX ) 20 MG tablet Take 1 tablet by mouth every morning (before breakfast) 30 tablet 3    ondansetron  (ZOFRAN -ODT) 4 MG disintegrating tablet Take  1 tablet by mouth every 8 hours as needed for Nausea or Vomiting 15 tablet 0    risperiDONE (RISPERDAL PO) Take by mouth Unknown dose and frequency per pt, pt not taking      OXcarbazepine (TRILEPTAL PO) Take by mouth Unknown dose and frequency, pt not taking       No current facility-administered medications for this visit.       Penicillins  Social History     Tobacco Use   Smoking Status Every Day    Current packs/day: 0.50    Types: Cigarettes    Passive exposure: Current   Smokeless Tobacco Not on file   Tobacco Comments    Current smoker: 0.5 pack/day x 25 yrs per pt      (If patient a smoker, smoking cessation counseling offered)   Social History     Substance and Sexual Activity   Alcohol Use Not on file       Physical Exam:    This a 58 y.o. female      Vitals:    03/18/24 1332   BP: 126/78   Pulse: 70     Body mass index is 24.79 kg/m.  Abdomen: Soft,  non-tender, non-distended with no CVA, flank pain.  Bladder non-tender and not distended.    Assessment and Plan   Assessment   1. Renal abscess, right    2. Urgency-frequency syndrome    3. Nocturia            Plan:   Plan    Return depends on results of repeat CT.  The patient presents with a Right renal abscess noted on 02/09/24 CT and treated conservatively with 6-8 weeks of antibiotics since IR did not feel it was drainable.  Patient denies any flank pain, nausea, vomiting or gross hematuria.  She completed her course of antibiotics.  Today's UA was negative.  She will need to be re-imaged with a CT abdomen with contrast.  Patient has urgency and frequency due to her excessive coffee, pop, and water  intake.  Patient has nocturia x 3; patient instructed to limit fluid intake 2-3 hours prior to bedtime to minimize nocturia or nocturnal incontinence.           Katherine CORDOBA Katherine Royse, MD FACS  03/20/2024 4:34 PM        "

## 2024-04-09 ENCOUNTER — Ambulatory Visit: Payer: Medicaid (Managed Care) | Primary: Specialist

## 2024-04-12 ENCOUNTER — Ambulatory Visit: Payer: Medicaid (Managed Care) | Primary: Specialist

## 2024-04-15 ENCOUNTER — Inpatient Hospital Stay: Admit: 2024-04-15 | Payer: Medicaid (Managed Care) | Attending: Specialist | Primary: Specialist

## 2024-04-15 ENCOUNTER — Ambulatory Visit: Payer: Medicaid (Managed Care) | Primary: Specialist

## 2024-04-15 MED ORDER — IOPAMIDOL 76 % IV SOLN
76 | Freq: Once | INTRAVENOUS | Status: AC | PRN
Start: 2024-04-15 — End: 2024-04-15
  Administered 2024-04-15: 14:00:00 75 mL via INTRAVENOUS

## 2024-07-01 ENCOUNTER — Encounter (HOSPITAL_COMMUNITY): Payer: Self-pay | Admitting: Emergency Medicine

## 2024-07-01 ENCOUNTER — Emergency Department (HOSPITAL_COMMUNITY)

## 2024-07-01 ENCOUNTER — Emergency Department (HOSPITAL_COMMUNITY)
Admission: EM | Admit: 2024-07-01 | Discharge: 2024-07-02 | Disposition: A | Source: Home / Self Care | Attending: Emergency Medicine | Admitting: Emergency Medicine

## 2024-07-01 ENCOUNTER — Other Ambulatory Visit: Payer: Self-pay

## 2024-07-01 DIAGNOSIS — I609 Nontraumatic subarachnoid hemorrhage, unspecified: Secondary | ICD-10-CM

## 2024-07-01 DIAGNOSIS — W19XXXA Unspecified fall, initial encounter: Secondary | ICD-10-CM

## 2024-07-01 MED ORDER — ACETAMINOPHEN 500 MG PO TABS
1000.0000 mg | ORAL_TABLET | Freq: Once | ORAL | Status: AC
  Administered 2024-07-01: 1000 mg via ORAL
  Filled 2024-07-01: qty 2

## 2024-07-01 NOTE — ED Provider Triage Note (Signed)
" °  Emergency Medicine Provider Triage Evaluation Note  MRN:  [id number]  Arrival date & time: 07/01/24    Medically screening exam initiated at 10:55 PM.   CC:   Fall   HPI:  Natasha Day is a 59 y.o. year-old female presents to the ED with chief complaint of fall.  She was getting out of the car, thinks she slipped and then woke up on the ground.  She states she did pass out.  She is not anticoagulated.  She complains of headache, neck pain, elbow pain, and generalized soreness.  She was able to walk after the fall.  History provided by patient ROS:  -As included in HPI PE:   Vitals:   07/01/24 2226  BP: (!) 131/95  Pulse: 96  Resp: 20  Temp: 98 F (36.7 C)  SpO2: 97%    Non-toxic appearing No respiratory distress  MDM:  Based on signs and symptoms, trauma from fall is highest on my differential. I've ordered imaging in triage to expedite lab/diagnostic workup.  Patient was informed that the remainder of the evaluation will be completed by another provider, this initial triage assessment does not replace that evaluation, and the importance of remaining in the ED until their evaluation is complete.    Vicky Charleston, PA-C 07/01/24 2256  "

## 2024-07-01 NOTE — ED Triage Notes (Signed)
 Patient fell about 20 minutes and hit her head. She landed on her back and states that she had a LOC. She denies nausea and vomiting. She does have a bad headache and feels dizzy.

## 2024-07-01 NOTE — ED Triage Notes (Addendum)
 Patient reports she fell on black ice, landing on her back and hitting her head.  Patient endorses LOC, no thinners. Patient c/o lower back pain, neck pain, and left elbow pain.

## 2024-07-02 LAB — CBC WITH DIFFERENTIAL/PLATELET
Abs Immature Granulocytes: 0.04 10*3/uL (ref 0.00–0.07)
Basophils Absolute: 0.1 10*3/uL (ref 0.0–0.1)
Basophils Relative: 1 %
Eosinophils Absolute: 0.5 10*3/uL (ref 0.0–0.5)
Eosinophils Relative: 4 %
HCT: 45.3 % (ref 36.0–46.0)
Hemoglobin: 15.2 g/dL — ABNORMAL HIGH (ref 12.0–15.0)
Immature Granulocytes: 0 %
Lymphocytes Relative: 41 %
Lymphs Abs: 5.6 10*3/uL — ABNORMAL HIGH (ref 0.7–4.0)
MCH: 30.9 pg (ref 26.0–34.0)
MCHC: 33.6 g/dL (ref 30.0–36.0)
MCV: 92.1 fL (ref 80.0–100.0)
Monocytes Absolute: 0.8 10*3/uL (ref 0.1–1.0)
Monocytes Relative: 6 %
Neutro Abs: 6.8 10*3/uL (ref 1.7–7.7)
Neutrophils Relative %: 48 %
Platelets: 394 10*3/uL (ref 150–400)
RBC: 4.92 MIL/uL (ref 3.87–5.11)
RDW: 13.7 % (ref 11.5–15.5)
WBC: 13.8 10*3/uL — ABNORMAL HIGH (ref 4.0–10.5)
nRBC: 0 % (ref 0.0–0.2)

## 2024-07-02 LAB — COMPREHENSIVE METABOLIC PANEL WITH GFR
ALT: 13 U/L (ref 0–44)
AST: 15 U/L (ref 15–41)
Albumin: 4.1 g/dL (ref 3.5–5.0)
Alkaline Phosphatase: 132 U/L — ABNORMAL HIGH (ref 38–126)
Anion gap: 10 (ref 5–15)
BUN: 8 mg/dL (ref 6–20)
CO2: 23 mmol/L (ref 22–32)
Calcium: 9.3 mg/dL (ref 8.9–10.3)
Chloride: 107 mmol/L (ref 98–111)
Creatinine, Ser: 0.98 mg/dL (ref 0.44–1.00)
GFR, Estimated: >60 mL/min
Glucose, Bld: 102 mg/dL — ABNORMAL HIGH (ref 70–99)
Potassium: 3.6 mmol/L (ref 3.5–5.1)
Sodium: 140 mmol/L (ref 135–145)
Total Bilirubin: 0.4 mg/dL (ref 0.0–1.2)
Total Protein: 7.1 g/dL (ref 6.5–8.1)

## 2024-07-02 LAB — PROTIME-INR
INR: 1.2 (ref 0.8–1.2)
Prothrombin Time: 15.6 s — ABNORMAL HIGH (ref 11.4–15.2)

## 2024-07-02 MED ORDER — AMLODIPINE BESYLATE 5 MG PO TABS
10.0000 mg | ORAL_TABLET | Freq: Once | ORAL | Status: AC
  Administered 2024-07-02: 10 mg via ORAL
  Filled 2024-07-02: qty 2

## 2024-07-02 MED ORDER — FENTANYL CITRATE (PF) 50 MCG/ML IJ SOSY
50.0000 ug | PREFILLED_SYRINGE | Freq: Once | INTRAMUSCULAR | Status: AC
  Administered 2024-07-02: 50 ug via INTRAVENOUS
  Filled 2024-07-02: qty 1

## 2024-07-02 MED ORDER — LACTATED RINGERS IV BOLUS
1000.0000 mL | Freq: Once | INTRAVENOUS | Status: AC
  Administered 2024-07-02: 1000 mL via INTRAVENOUS

## 2024-07-02 NOTE — ED Provider Notes (Signed)
 " [facility] Provider Note   CSN: [id number] Arrival date & time: 07/01/24  2219     Patient presents with: Fall   Natasha Day is a 59 y.o. female.   59 year old female who presents the ER today after a fall with persistent headache and syncope.  Patient states that she slipped on some ice.  She members falling she was falling back and hit her head and then everything went black for a few seconds.  She woke up.  She has had a headache since that time.  Low bit nausea no vomiting.  No neurologic changes.  Able to ambulate without difficulty.  Only area of pain is her left elbow and her head.  No blood thinners.  No other injuries.  Patient's blood pressure is a bit high and she thinks is because she missed her doses last couple days as there is some issue with get her refills but she stated she should be able to get her amlodipine  filled on Saturday.   Fall       Prior to Admission medications  Medication Sig Start Date End Date Taking? Authorizing Provider  amLODipine  (NORVASC ) 10 MG tablet Take 10 mg by mouth daily.     [provider]  hydrOXYzine (ATARAX/VISTARIL) 25 MG tablet Take 25 mg by mouth every 8 (eight) hours as needed for anxiety or vomiting.     [provider]    Allergies: Hydrocodone-acetaminophen , Penicillins, Atorvastatin, Diclofenac, Erythromycin, Gabapentin, Iodine, Latex, Norco [hydrocodone-acetaminophen ], Ondansetron  hcl, Shellfish allergy, Iodinated contrast media, Methocarbamol, and Sulfa antibiotics    Review of Systems  Updated Vital Signs BP (!) 140/88   Pulse 90   Temp 98 F (36.7 C)   Resp 20   Wt 102.1 kg   LMP 06/24/2018   SpO2 99%   BMI 34.21 kg/m   Physical Exam Vitals and nursing note reviewed.  Constitutional:      Appearance: She is well-developed.  HENT:     Head: Normocephalic and atraumatic.     Mouth/Throat:     Mouth: Mucous membranes are moist.  Eyes:      Pupils: Pupils are equal, round, and reactive to light.  Cardiovascular:     Rate and Rhythm: Normal rate and regular rhythm.  Pulmonary:     Effort: No respiratory distress.     Breath sounds: No stridor.  Abdominal:     General: There is no distension.  Musculoskeletal:        General: No swelling or tenderness. Normal range of motion.     Cervical back: Normal range of motion.  Skin:    General: Skin is warm and dry.  Neurological:     General: No focal deficit present.     Mental Status: She is alert.     Comments: No altered mental status, able to give full seemingly accurate history.  Face is symmetric, EOM's intact, pupils equal and reactive, vision intact, tongue and uvula midline without deviation, hearing not noticeably abnormal. Taste and smell not tested. Upper and Lower extremity motor 5/5, intact pain perception in distal extremities, 2+ reflexes in biceps, patella and achilles tendons. Able to perform finger to nose normal with both hands. Walks without assistance or evident ataxia.       (all labs ordered are listed, but only abnormal results are displayed) Labs Reviewed  CBC WITH DIFFERENTIAL/PLATELET - Abnormal; Notable for the following components:      Result Value  WBC 13.8 (*)    Hemoglobin 15.2 (*)    Lymphs Abs 5.6 (*)    All other components within normal limits  COMPREHENSIVE METABOLIC PANEL WITH GFR - Abnormal; Notable for the following components:   Glucose, Bld 102 (*)    Alkaline Phosphatase 132 (*)    All other components within normal limits  PROTIME-INR - Abnormal; Notable for the following components:   Prothrombin Time 15.6 (*)    All other components within normal limits    EKG: None  Radiology: CT Cervical Spine Wo Contrast Result Date: 07/01/2024 EXAM: CT CERVICAL SPINE WITHOUT CONTRAST 07/01/2024 11:31:44 PM TECHNIQUE: CT of the cervical spine was performed without the administration of intravenous contrast. Multiplanar reformatted  images are provided for review. Automated exposure control, iterative reconstruction, and/or weight based adjustment of the mA/kV was utilized to reduce the radiation dose to as low as reasonably achievable. COMPARISON: None available. CLINICAL HISTORY: Ataxia, cervical trauma. FINDINGS: BONES AND ALIGNMENT: No acute fracture or traumatic malalignment. DEGENERATIVE CHANGES: Mild degenerative changes of the mid cervical spine. SOFT TISSUES: No prevertebral soft tissue swelling. IMPRESSION: 1. No acute findings. Electronically signed by: Pinkie Pebbles MD 07/01/2024 11:43 PM EST RP Workstation: [id number]   CT Head Wo Contrast Result Date: 07/01/2024 EXAM: CT HEAD WITHOUT CONTRAST 07/01/2024 11:31:44 PM TECHNIQUE: CT of the head was performed without the administration of intravenous contrast. Automated exposure control, iterative reconstruction, and/or weight based adjustment of the mA/kV was utilized to reduce the radiation dose to as low as reasonably achievable. COMPARISON: 12/19/2023 CLINICAL HISTORY: Head trauma with abnormal mental status (Age 61-64y). FINDINGS: BRAIN AND VENTRICLES: Subtle hyperdensity overlying the right frontal lobe (axial image 25; coronal image 32), raising concern for very mild subarachnoid hemorrhage. No evidence of acute infarct. No hydrocephalus. No extra-axial collection. No mass effect or midline shift. Calcifications along falx and tentorium, similar to prior. SINUSES: No acute abnormality. SOFT TISSUES AND SKULL: No acute soft tissue abnormality. No skull fracture. IMPRESSION: 1. Subtle hyperdensity overlying the right frontal lobe, concerning for very mild subarachnoid hemorrhage, although equivocal. Consider MRI brain for further characterization. Electronically signed by: Pinkie Pebbles MD 07/01/2024 11:40 PM EST RP Workstation: [id number]   DG Elbow Complete Left Result Date: 07/01/2024 EXAM: 3 VIEW(S) XRAY OF THE LEFT ELBOW COMPARISON: None available. CLINICAL HISTORY:  Fall; left elbow pain. FINDINGS: BONES AND JOINTS: No acute fracture. No malalignment. Mild degenerative changes. SOFT TISSUES: Unremarkable. IMPRESSION: 1. No acute fracture or dislocation. Electronically signed by: Pinkie Pebbles MD 07/01/2024 11:18 PM EST RP Workstation: [id number]   DG Chest 2 View Result Date: 07/01/2024 EXAM: 2 VIEW(S) XRAY OF THE CHEST 07/01/2024 11:13:00 PM COMPARISON: C2-6 / 14 / 25. CLINICAL HISTORY: Chest pain. FINDINGS: LUNGS AND PLEURA: No focal pulmonary opacity. No pleural effusion. No pneumothorax. HEART AND MEDIASTINUM: No acute abnormality of the cardiac and mediastinal silhouettes. BONES AND SOFT TISSUES: Thoracic degenerative changes. IMPRESSION: 1. No acute process. Electronically signed by: Norman Gatlin MD 07/01/2024 11:17 PM EST RP Workstation: [id number]     Procedures   Medications Ordered in the ED  acetaminophen  (TYLENOL ) tablet 1,000 mg (1,000 mg Oral Given 07/01/24 2258)  lactated ringers  bolus 1,000 mL (0 mLs Intravenous Stopped 07/02/24 0401)  fentaNYL  (SUBLIMAZE ) injection 50 mcg (50 mcg Intravenous Given 07/02/24 0308)  amLODipine  (NORVASC ) tablet 10 mg (10 mg Oral Given 07/02/24 0320)  Medical Decision Making Amount and/or Complexity of Data Reviewed Labs: ordered. Radiology: ordered.  Risk Prescription drug management.   Patient with questionable subarachnoid on CT.  She did have syncopal episode and hit her head pretty hard so it is discussed with Dr. Levander with neurosurgery who recommended observation and reevaluation.  No reason to get a repeat CT scan unless persistently symptomatic or change in clinical status.  Patient was observed for approximately 4 hours after talking to neurosurgery and nearly 8 hours after the CT scan which would be 9 to 10 hours after the actual fall.  Patient felt well.  I did give her home dose of amlodipine  since she missed her dose yesterday.  Limited nausea from the pain  medication but still able to tolerate p.o.  Multiple times.  Able to ambulate without difficulty.  Discussed reasons to return the ER such as neurologic symptoms, decrease in mental status, worsening headache, vomiting, neurologic changes or anything else concerning.  Otherwise will follow-up with neurosurgery as an outpatient.    Final diagnoses:  Fall, initial encounter  SAH (subarachnoid hemorrhage) [facility])    ED Discharge Orders     None          Shaquil Aldana, Selinda, MD 07/02/24 [phone]  "

## 2024-07-02 NOTE — ED Notes (Signed)
 Patient

## 2024-07-02 NOTE — ED Notes (Signed)
 Patient tolerated eating sandwich.

## 2024-07-02 NOTE — ED Notes (Signed)
 Patient ambulated to bathroom with no issues or complaints, steady gait.
